# Patient Record
Sex: Male | Born: 1945 | Race: White | Hispanic: No | Marital: Married | Smoking: Former smoker
Health system: Southern US, Community
[De-identification: ages and names within clinical notes are randomized; demographics above are authoritative.]

## PROBLEM LIST (undated history)

## (undated) DIAGNOSIS — I714 Abdominal aortic aneurysm, without rupture, unspecified: Secondary | ICD-10-CM

## (undated) DIAGNOSIS — R319 Hematuria, unspecified: Secondary | ICD-10-CM

## (undated) DIAGNOSIS — K5792 Diverticulitis of intestine, part unspecified, without perforation or abscess without bleeding: Secondary | ICD-10-CM

## (undated) DIAGNOSIS — K922 Gastrointestinal hemorrhage, unspecified: Secondary | ICD-10-CM

## (undated) DIAGNOSIS — I739 Peripheral vascular disease, unspecified: Secondary | ICD-10-CM

## (undated) DIAGNOSIS — N400 Enlarged prostate without lower urinary tract symptoms: Secondary | ICD-10-CM

## (undated) HISTORY — PX: KNEE SURGERY: SHX244

## (undated) HISTORY — DX: Diverticulitis of intestine, part unspecified, without perforation or abscess without bleeding: K57.92

## (undated) HISTORY — DX: Abdominal aortic aneurysm, without rupture: I71.4

## (undated) HISTORY — PX: TONSILLECTOMY: SUR1361

## (undated) HISTORY — DX: Gastrointestinal hemorrhage, unspecified: K92.2

## (undated) HISTORY — PX: OTHER SURGICAL HISTORY: SHX169

## (undated) HISTORY — DX: Benign prostatic hyperplasia without lower urinary tract symptoms: N40.0

## (undated) HISTORY — DX: Abdominal aortic aneurysm, without rupture, unspecified: I71.40

## (undated) HISTORY — DX: Hematuria, unspecified: R31.9

## (undated) HISTORY — PX: ADENOIDECTOMY: SUR15

## (undated) HISTORY — DX: Peripheral vascular disease, unspecified: I73.9

---

## 1996-12-27 HISTORY — PX: OTHER SURGICAL HISTORY: SHX169

## 1999-11-17 ENCOUNTER — Encounter: Payer: Self-pay | Admitting: Family Medicine

## 1999-11-17 ENCOUNTER — Encounter: Admission: RE | Admit: 1999-11-17 | Discharge: 1999-11-17 | Payer: Self-pay | Admitting: Family Medicine

## 2000-06-24 ENCOUNTER — Ambulatory Visit (HOSPITAL_COMMUNITY): Admission: RE | Admit: 2000-06-24 | Discharge: 2000-06-24 | Payer: Self-pay | Admitting: Gastroenterology

## 2001-11-01 ENCOUNTER — Ambulatory Visit (HOSPITAL_COMMUNITY): Admission: RE | Admit: 2001-11-01 | Discharge: 2001-11-01 | Payer: Self-pay | Admitting: Urology

## 2001-11-01 ENCOUNTER — Encounter: Payer: Self-pay | Admitting: Urology

## 2006-07-21 ENCOUNTER — Inpatient Hospital Stay (HOSPITAL_COMMUNITY): Admission: EM | Admit: 2006-07-21 | Discharge: 2006-07-25 | Payer: Self-pay | Admitting: Emergency Medicine

## 2006-10-27 HISTORY — PX: OTHER SURGICAL HISTORY: SHX169

## 2006-11-16 ENCOUNTER — Inpatient Hospital Stay (HOSPITAL_COMMUNITY): Admission: RE | Admit: 2006-11-16 | Discharge: 2006-11-22 | Payer: Self-pay | Admitting: Surgery

## 2011-06-27 HISTORY — PX: COLONOSCOPY: SHX174

## 2012-05-11 ENCOUNTER — Other Ambulatory Visit: Payer: Self-pay | Admitting: Family Medicine

## 2012-05-11 DIAGNOSIS — I714 Abdominal aortic aneurysm, without rupture: Secondary | ICD-10-CM

## 2012-05-12 ENCOUNTER — Ambulatory Visit
Admission: RE | Admit: 2012-05-12 | Discharge: 2012-05-12 | Disposition: A | Discharge status: Home / Self Care | Payer: Medicare Other | Source: Ambulatory Visit | Attending: Family Medicine | Admitting: Family Medicine

## 2012-05-12 DIAGNOSIS — I714 Abdominal aortic aneurysm, without rupture: Secondary | ICD-10-CM

## 2012-05-12 MED ORDER — IOHEXOL 350 MG/ML SOLN
100.0000 mL | Freq: Once | INTRAVENOUS | Status: AC | PRN
  Administered 2012-05-12: 100 mL via INTRAVENOUS

## 2013-05-27 HISTORY — PX: OTHER SURGICAL HISTORY: SHX169

## 2013-06-07 ENCOUNTER — Other Ambulatory Visit: Payer: Self-pay | Admitting: Otolaryngology

## 2014-05-17 ENCOUNTER — Other Ambulatory Visit: Payer: Self-pay | Admitting: Family Medicine

## 2014-05-17 DIAGNOSIS — I714 Abdominal aortic aneurysm, without rupture, unspecified: Secondary | ICD-10-CM

## 2014-05-17 DIAGNOSIS — IMO0001 Reserved for inherently not codable concepts without codable children: Secondary | ICD-10-CM

## 2014-05-22 ENCOUNTER — Ambulatory Visit: Payer: Medicare PPO | Attending: Family Medicine

## 2014-05-22 DIAGNOSIS — IMO0001 Reserved for inherently not codable concepts without codable children: Secondary | ICD-10-CM | POA: Insufficient documentation

## 2014-05-22 DIAGNOSIS — M545 Low back pain, unspecified: Secondary | ICD-10-CM | POA: Insufficient documentation

## 2014-05-22 DIAGNOSIS — R5381 Other malaise: Secondary | ICD-10-CM | POA: Insufficient documentation

## 2014-05-23 ENCOUNTER — Ambulatory Visit
Admission: RE | Admit: 2014-05-23 | Discharge: 2014-05-23 | Disposition: A | Discharge status: Home / Self Care | Payer: Medicare PPO | Source: Ambulatory Visit | Attending: Family Medicine | Admitting: Family Medicine

## 2014-05-23 DIAGNOSIS — IMO0001 Reserved for inherently not codable concepts without codable children: Secondary | ICD-10-CM

## 2014-05-23 DIAGNOSIS — I714 Abdominal aortic aneurysm, without rupture, unspecified: Secondary | ICD-10-CM

## 2014-06-05 ENCOUNTER — Ambulatory Visit: Payer: Medicare PPO | Attending: Family Medicine

## 2014-06-05 DIAGNOSIS — IMO0001 Reserved for inherently not codable concepts without codable children: Secondary | ICD-10-CM | POA: Insufficient documentation

## 2014-06-05 DIAGNOSIS — R5381 Other malaise: Secondary | ICD-10-CM | POA: Insufficient documentation

## 2014-06-05 DIAGNOSIS — M545 Low back pain, unspecified: Secondary | ICD-10-CM | POA: Insufficient documentation

## 2014-06-19 ENCOUNTER — Ambulatory Visit: Payer: Medicare PPO

## 2014-06-26 ENCOUNTER — Ambulatory Visit: Payer: Medicare PPO | Attending: Family Medicine

## 2014-06-26 DIAGNOSIS — M545 Low back pain, unspecified: Secondary | ICD-10-CM | POA: Insufficient documentation

## 2014-06-26 DIAGNOSIS — R5381 Other malaise: Secondary | ICD-10-CM | POA: Insufficient documentation

## 2014-06-26 DIAGNOSIS — IMO0001 Reserved for inherently not codable concepts without codable children: Secondary | ICD-10-CM | POA: Diagnosis present

## 2014-07-11 DIAGNOSIS — M545 Low back pain, unspecified: Secondary | ICD-10-CM | POA: Insufficient documentation

## 2014-07-11 DIAGNOSIS — M25562 Pain in left knee: Secondary | ICD-10-CM | POA: Insufficient documentation

## 2014-07-11 DIAGNOSIS — M1990 Unspecified osteoarthritis, unspecified site: Secondary | ICD-10-CM | POA: Insufficient documentation

## 2014-07-11 DIAGNOSIS — M79642 Pain in left hand: Secondary | ICD-10-CM | POA: Insufficient documentation

## 2014-12-30 DIAGNOSIS — M25372 Other instability, left ankle: Secondary | ICD-10-CM | POA: Diagnosis not present

## 2014-12-30 DIAGNOSIS — M66872 Spontaneous rupture of other tendons, left ankle and foot: Secondary | ICD-10-CM | POA: Diagnosis not present

## 2014-12-30 DIAGNOSIS — M67972 Unspecified disorder of synovium and tendon, left ankle and foot: Secondary | ICD-10-CM | POA: Diagnosis not present

## 2014-12-30 DIAGNOSIS — M79672 Pain in left foot: Secondary | ICD-10-CM | POA: Diagnosis not present

## 2015-01-03 DIAGNOSIS — M66872 Spontaneous rupture of other tendons, left ankle and foot: Secondary | ICD-10-CM | POA: Diagnosis not present

## 2015-01-03 DIAGNOSIS — M67972 Unspecified disorder of synovium and tendon, left ankle and foot: Secondary | ICD-10-CM | POA: Diagnosis not present

## 2015-01-03 DIAGNOSIS — M79672 Pain in left foot: Secondary | ICD-10-CM | POA: Diagnosis not present

## 2015-01-03 DIAGNOSIS — M25372 Other instability, left ankle: Secondary | ICD-10-CM | POA: Diagnosis not present

## 2015-01-06 DIAGNOSIS — M66872 Spontaneous rupture of other tendons, left ankle and foot: Secondary | ICD-10-CM | POA: Diagnosis not present

## 2015-01-06 DIAGNOSIS — M25372 Other instability, left ankle: Secondary | ICD-10-CM | POA: Diagnosis not present

## 2015-01-06 DIAGNOSIS — M67972 Unspecified disorder of synovium and tendon, left ankle and foot: Secondary | ICD-10-CM | POA: Diagnosis not present

## 2015-01-06 DIAGNOSIS — M79672 Pain in left foot: Secondary | ICD-10-CM | POA: Diagnosis not present

## 2015-01-09 DIAGNOSIS — M25372 Other instability, left ankle: Secondary | ICD-10-CM | POA: Diagnosis not present

## 2015-01-09 DIAGNOSIS — M67972 Unspecified disorder of synovium and tendon, left ankle and foot: Secondary | ICD-10-CM | POA: Diagnosis not present

## 2015-01-09 DIAGNOSIS — M79672 Pain in left foot: Secondary | ICD-10-CM | POA: Diagnosis not present

## 2015-01-09 DIAGNOSIS — M66872 Spontaneous rupture of other tendons, left ankle and foot: Secondary | ICD-10-CM | POA: Diagnosis not present

## 2015-01-20 DIAGNOSIS — M25372 Other instability, left ankle: Secondary | ICD-10-CM | POA: Diagnosis not present

## 2015-01-20 DIAGNOSIS — M67972 Unspecified disorder of synovium and tendon, left ankle and foot: Secondary | ICD-10-CM | POA: Diagnosis not present

## 2015-01-20 DIAGNOSIS — M79672 Pain in left foot: Secondary | ICD-10-CM | POA: Diagnosis not present

## 2015-01-20 DIAGNOSIS — M66872 Spontaneous rupture of other tendons, left ankle and foot: Secondary | ICD-10-CM | POA: Diagnosis not present

## 2015-01-24 DIAGNOSIS — M67972 Unspecified disorder of synovium and tendon, left ankle and foot: Secondary | ICD-10-CM | POA: Diagnosis not present

## 2015-01-24 DIAGNOSIS — M66872 Spontaneous rupture of other tendons, left ankle and foot: Secondary | ICD-10-CM | POA: Diagnosis not present

## 2015-01-24 DIAGNOSIS — M79672 Pain in left foot: Secondary | ICD-10-CM | POA: Diagnosis not present

## 2015-01-24 DIAGNOSIS — M25372 Other instability, left ankle: Secondary | ICD-10-CM | POA: Diagnosis not present

## 2015-01-29 DIAGNOSIS — M67972 Unspecified disorder of synovium and tendon, left ankle and foot: Secondary | ICD-10-CM | POA: Diagnosis not present

## 2015-01-29 DIAGNOSIS — M66872 Spontaneous rupture of other tendons, left ankle and foot: Secondary | ICD-10-CM | POA: Diagnosis not present

## 2015-01-29 DIAGNOSIS — M25372 Other instability, left ankle: Secondary | ICD-10-CM | POA: Diagnosis not present

## 2015-01-29 DIAGNOSIS — M79672 Pain in left foot: Secondary | ICD-10-CM | POA: Diagnosis not present

## 2015-02-07 DIAGNOSIS — M67972 Unspecified disorder of synovium and tendon, left ankle and foot: Secondary | ICD-10-CM | POA: Diagnosis not present

## 2015-02-07 DIAGNOSIS — M66872 Spontaneous rupture of other tendons, left ankle and foot: Secondary | ICD-10-CM | POA: Diagnosis not present

## 2015-02-07 DIAGNOSIS — M25372 Other instability, left ankle: Secondary | ICD-10-CM | POA: Diagnosis not present

## 2015-02-07 DIAGNOSIS — M79672 Pain in left foot: Secondary | ICD-10-CM | POA: Diagnosis not present

## 2015-02-20 DIAGNOSIS — M66872 Spontaneous rupture of other tendons, left ankle and foot: Secondary | ICD-10-CM | POA: Diagnosis not present

## 2015-02-20 DIAGNOSIS — M79672 Pain in left foot: Secondary | ICD-10-CM | POA: Diagnosis not present

## 2015-02-20 DIAGNOSIS — M67972 Unspecified disorder of synovium and tendon, left ankle and foot: Secondary | ICD-10-CM | POA: Diagnosis not present

## 2015-02-20 DIAGNOSIS — M25372 Other instability, left ankle: Secondary | ICD-10-CM | POA: Diagnosis not present

## 2015-02-26 DIAGNOSIS — M67972 Unspecified disorder of synovium and tendon, left ankle and foot: Secondary | ICD-10-CM | POA: Diagnosis not present

## 2015-02-26 DIAGNOSIS — M79672 Pain in left foot: Secondary | ICD-10-CM | POA: Diagnosis not present

## 2015-02-26 DIAGNOSIS — M25372 Other instability, left ankle: Secondary | ICD-10-CM | POA: Diagnosis not present

## 2015-02-26 DIAGNOSIS — M66872 Spontaneous rupture of other tendons, left ankle and foot: Secondary | ICD-10-CM | POA: Diagnosis not present

## 2015-03-07 DIAGNOSIS — M25372 Other instability, left ankle: Secondary | ICD-10-CM | POA: Diagnosis not present

## 2015-03-07 DIAGNOSIS — M79672 Pain in left foot: Secondary | ICD-10-CM | POA: Diagnosis not present

## 2015-03-07 DIAGNOSIS — M67972 Unspecified disorder of synovium and tendon, left ankle and foot: Secondary | ICD-10-CM | POA: Diagnosis not present

## 2015-03-07 DIAGNOSIS — M66872 Spontaneous rupture of other tendons, left ankle and foot: Secondary | ICD-10-CM | POA: Diagnosis not present

## 2015-05-20 DIAGNOSIS — E780 Pure hypercholesterolemia: Secondary | ICD-10-CM | POA: Diagnosis not present

## 2015-05-20 DIAGNOSIS — Z79899 Other long term (current) drug therapy: Secondary | ICD-10-CM | POA: Diagnosis not present

## 2015-05-20 DIAGNOSIS — Z125 Encounter for screening for malignant neoplasm of prostate: Secondary | ICD-10-CM | POA: Diagnosis not present

## 2015-05-29 DIAGNOSIS — Z79899 Other long term (current) drug therapy: Secondary | ICD-10-CM | POA: Diagnosis not present

## 2015-05-29 DIAGNOSIS — E780 Pure hypercholesterolemia: Secondary | ICD-10-CM | POA: Diagnosis not present

## 2015-05-29 DIAGNOSIS — M1990 Unspecified osteoarthritis, unspecified site: Secondary | ICD-10-CM | POA: Diagnosis not present

## 2015-05-29 DIAGNOSIS — Z0000 Encounter for general adult medical examination without abnormal findings: Secondary | ICD-10-CM | POA: Diagnosis not present

## 2015-05-29 DIAGNOSIS — M545 Low back pain: Secondary | ICD-10-CM | POA: Diagnosis not present

## 2015-05-29 DIAGNOSIS — N400 Enlarged prostate without lower urinary tract symptoms: Secondary | ICD-10-CM | POA: Diagnosis not present

## 2015-05-29 DIAGNOSIS — R972 Elevated prostate specific antigen [PSA]: Secondary | ICD-10-CM | POA: Diagnosis not present

## 2015-05-29 DIAGNOSIS — N5201 Erectile dysfunction due to arterial insufficiency: Secondary | ICD-10-CM | POA: Diagnosis not present

## 2015-09-15 DIAGNOSIS — M545 Low back pain: Secondary | ICD-10-CM | POA: Diagnosis not present

## 2015-10-14 DIAGNOSIS — Z23 Encounter for immunization: Secondary | ICD-10-CM | POA: Diagnosis not present

## 2015-10-14 DIAGNOSIS — M5416 Radiculopathy, lumbar region: Secondary | ICD-10-CM | POA: Diagnosis not present

## 2015-10-16 ENCOUNTER — Other Ambulatory Visit: Payer: Self-pay | Admitting: Family Medicine

## 2015-10-16 DIAGNOSIS — M5416 Radiculopathy, lumbar region: Secondary | ICD-10-CM

## 2015-10-27 ENCOUNTER — Ambulatory Visit
Admission: RE | Admit: 2015-10-27 | Discharge: 2015-10-27 | Disposition: A | Discharge status: Home / Self Care | Payer: Commercial Managed Care - HMO | Source: Ambulatory Visit | Attending: Family Medicine | Admitting: Family Medicine

## 2015-10-27 DIAGNOSIS — M4806 Spinal stenosis, lumbar region: Secondary | ICD-10-CM | POA: Diagnosis not present

## 2015-10-27 DIAGNOSIS — M5416 Radiculopathy, lumbar region: Secondary | ICD-10-CM

## 2015-10-28 DIAGNOSIS — I714 Abdominal aortic aneurysm, without rupture: Secondary | ICD-10-CM | POA: Diagnosis not present

## 2015-10-28 DIAGNOSIS — M5416 Radiculopathy, lumbar region: Secondary | ICD-10-CM | POA: Diagnosis not present

## 2015-10-30 DIAGNOSIS — M5416 Radiculopathy, lumbar region: Secondary | ICD-10-CM | POA: Diagnosis not present

## 2015-10-30 DIAGNOSIS — M545 Low back pain: Secondary | ICD-10-CM | POA: Diagnosis not present

## 2015-11-04 DIAGNOSIS — M5416 Radiculopathy, lumbar region: Secondary | ICD-10-CM | POA: Diagnosis not present

## 2015-11-04 DIAGNOSIS — M545 Low back pain: Secondary | ICD-10-CM | POA: Diagnosis not present

## 2015-11-10 ENCOUNTER — Other Ambulatory Visit: Payer: Self-pay | Admitting: *Deleted

## 2015-11-10 DIAGNOSIS — M5416 Radiculopathy, lumbar region: Secondary | ICD-10-CM | POA: Diagnosis not present

## 2015-11-10 DIAGNOSIS — I714 Abdominal aortic aneurysm, without rupture, unspecified: Secondary | ICD-10-CM

## 2015-11-10 DIAGNOSIS — M545 Low back pain: Secondary | ICD-10-CM | POA: Diagnosis not present

## 2015-11-13 DIAGNOSIS — M5416 Radiculopathy, lumbar region: Secondary | ICD-10-CM | POA: Diagnosis not present

## 2015-11-13 DIAGNOSIS — M545 Low back pain: Secondary | ICD-10-CM | POA: Diagnosis not present

## 2015-11-17 ENCOUNTER — Encounter: Payer: Self-pay | Admitting: Surgery

## 2015-11-17 DIAGNOSIS — M545 Low back pain: Secondary | ICD-10-CM | POA: Diagnosis not present

## 2015-11-17 DIAGNOSIS — M5416 Radiculopathy, lumbar region: Secondary | ICD-10-CM | POA: Diagnosis not present

## 2015-11-19 ENCOUNTER — Ambulatory Visit (INDEPENDENT_AMBULATORY_CARE_PROVIDER_SITE_OTHER): Payer: Commercial Managed Care - HMO | Admitting: Surgery

## 2015-11-19 ENCOUNTER — Encounter: Payer: Self-pay | Admitting: Surgery

## 2015-11-19 ENCOUNTER — Ambulatory Visit (HOSPITAL_COMMUNITY)
Admission: RE | Admit: 2015-11-19 | Discharge: 2015-11-19 | Disposition: A | Discharge status: Home / Self Care | Payer: Commercial Managed Care - HMO | Source: Ambulatory Visit | Attending: Surgery | Admitting: Surgery

## 2015-11-19 VITALS — BP 163/93 | HR 75 | Temp 98.1°F | Resp 16 | Ht 69.0 in | Wt 232.0 lb

## 2015-11-19 DIAGNOSIS — I714 Abdominal aortic aneurysm, without rupture, unspecified: Secondary | ICD-10-CM

## 2015-11-19 DIAGNOSIS — M545 Low back pain: Secondary | ICD-10-CM | POA: Diagnosis not present

## 2015-11-19 DIAGNOSIS — M5416 Radiculopathy, lumbar region: Secondary | ICD-10-CM | POA: Diagnosis not present

## 2015-11-19 DIAGNOSIS — I739 Peripheral vascular disease, unspecified: Secondary | ICD-10-CM | POA: Diagnosis not present

## 2015-11-19 NOTE — Progress Notes (Signed)
Patient name: Tanner Hall MRN: [id number] DOB: Oct 01, 1946 Sex: male   Referred by: Dr. Harrington Challenger  Reason for referral:  Chief Complaint  Patient presents with  . New Evaluation    Resf. by Dr. Harrington Challenger  C/O  Lumbar Spine MRI  and found AAA   2 cm    HISTORY OF PRESENT ILLNESS: The patient is referred today for evaluation of an abdominal aortic aneurysm.  The patient is concerned about this because his father had a abdominal aortic aneurysm.  She died when she was 88 and never required getting this fixed.  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  This is increased from an ultrasound that was done a year ago revealing a 2.4 cm aortic aneurysm.  [place].  The patient is a former smoker, having quit 10 years ago.  He has had surgical treatment of diverticulitis.  He suffers chronic low back pain.  Past Medical History  Diagnosis Date  . BPH (benign prostatic hyperplasia)   . Hematuria   . PVD (peripheral vascular disease) ([place])   . Diverticulitis   . GI bleed   . AAA (abdominal aortic aneurysm) ([facility])     3.1 cm    Past Surgical History  Procedure Laterality Date  . Double inguinal repair  1998  . Partial colectomy for diverticulitis  10/2006  . Cystoscopy for hematuria      NO CANCER FOUND  . Colonoscopy  06/2011  . 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 Knee surgery      Social History   Social History  . Marital Status: Married    Spouse Name: N/A  . Number of Children: N/A  . Years of Education: N/A   Occupational History  . Not on file.   Social History Main Topics  . Smoking status: Former Smoker    Quit date: 11/18/2006  . Smokeless tobacco: Never Used  . Alcohol Use: Yes  . Drug Use: No  . Sexual Activity: Not on file   Other Topics Concern  . Not on file   Social History Narrative    Family History  Problem Relation Age of Onset  . CVA Mother   .  Leukemia Father   . Stomach cancer Paternal Grandfather     Allergies as of 11/19/2015  . (No Known Allergies)    Current Outpatient Prescriptions on File Prior to Visit  Medication Sig Dispense Refill  . aspirin-acetaminophen-caffeine (EXCEDRIN MIGRAINE) 250-250-65 MG per tablet Take 2 tablets by mouth every 6 (six) hours as needed for headache.    . Cinnamon 500 MG TABS Take 500 mg by mouth daily.    . fexofenadine-pseudoephedrine (ALLEGRA-D 24) 180-240 MG per 24 hr tablet Take 1 tablet by mouth daily.    Marland Kitchen KRILL OIL PO Take by mouth daily.    . meloxicam (MOBIC) 15 MG tablet Take 15 mg by mouth daily.    . Misc Natural Products (OSTEO BI-FLEX ADV JOINT SHIELD PO) Take by mouth daily.    . naproxen sodium (ANAPROX) 220 MG tablet Take 220 mg by mouth 2 (two) times daily with a meal.    . Prenatal Vit-Fe Fumarate-FA (M-VIT) tablet Take 1 tablet by mouth daily.    . Probiotic Product (PROBIOTIC DAILY) CAPS Take by mouth daily.    . sildenafil (VIAGRA) 100 MG tablet Take 100  mg by mouth daily as needed for erectile dysfunction.    . traMADol (ULTRAM) 50 MG tablet Take by mouth every 6 (six) hours as needed.    Marland Kitchen glucosamine-chondroitin 500-400 MG tablet Take 1 tablet by mouth 3 (three) times daily.    . Misc Natural Products (PROSTATE HEALTH) CAPS Take by mouth daily.     No current facility-administered medications on file prior to visit.     REVIEW OF SYSTEMS: Cardiovascular: No chest pain, chest pressure, palpitations, orthopnea, or dyspnea on exertion. No claudication or rest pain,  No history of DVT or phlebitis. Pulmonary: No productive cough, asthma or wheezing. Neurologic: No weakness, paresthesias, aphasia, or amaurosis. No dizziness. Hematologic: No bleeding problems or clotting disorders. Musculoskeletal: No joint pain or joint swelling. Gastrointestinal: No blood in stool or hematemesis Genitourinary: No dysuria or hematuria. Psychiatric:: No history of major  depression. Integumentary: No rashes or ulcers. Constitutional: No fever or chills.  PHYSICAL EXAMINATION:  Filed Vitals:   11/19/15 0926 11/19/15 0933  BP: 173/84 163/93  Pulse: 78 75  Temp: 98.1 F (36.7 C)   TempSrc: Oral   Resp: 16   Height: 5\' 9"  (1.753 m)   Weight: 232 lb (105.235 kg)   SpO2: 95%    Body mass index is 34.24 kg/(m^2). General: The patient appears their stated age.   HEENT:  No gross abnormalities Pulmonary: Respirations are non-labored Abdomen: Soft and non-tender  Musculoskeletal: There are no major deformities.   Neurologic: No focal weakness or paresthesias are detected, Skin: There are no ulcer or rashes noted. Psychiatric: The patient has normal affect. Cardiovascular: There is a regular rate and rhythm without significant murmur appreciated.  No carotid bruits.  Palpable pedal pulses  Diagnostic Studies: I have reviewed his ultrasound study which confirms a 3.1 cm abdominal aortic aneurysm.  Iliac vessels were not well evaluated secondary to overlying bowel gas.  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  Assessment:  Abdominal aortic aneurysm Plan: I have discussed the natural history of aneurysmal degeneration and the indications for repair.  I have recommended continued surveillance.  The threshold for aneurysm repair would be 5 cm or rapid growth.  I have elected to bring the patient back in one year with a repeat abdominal aortic aneurysm ultrasound.  4 Proventil purposes, consider placing the patient on a statin if his cholesterol is elevated and an ACE inhibitor if his blood pressure remains elevated.  I will defer these decisions to Dr. Alcus Dad. Leia Alf, M.D. [facility] Office: [phone] Pager:  [phone]

## 2015-11-19 NOTE — Addendum Note (Signed)
Addended by: Dorthula Rue L on: 11/19/2015 02:01 PM   Modules accepted: Orders

## 2015-11-19 NOTE — Progress Notes (Signed)
Filed Vitals:   11/19/15 0926 11/19/15 0933  BP: 173/84 163/93  Pulse: 78 75  Temp: 98.1 F (36.7 C)   TempSrc: Oral   Resp: 16   Height: 5\' 9"  (1.753 m)   Weight: 232 lb (105.235 kg)   SpO2: 95%

## 2015-11-27 DIAGNOSIS — Z0000 Encounter for general adult medical examination without abnormal findings: Secondary | ICD-10-CM | POA: Diagnosis not present

## 2015-11-27 DIAGNOSIS — M5416 Radiculopathy, lumbar region: Secondary | ICD-10-CM | POA: Diagnosis not present

## 2015-11-27 DIAGNOSIS — R972 Elevated prostate specific antigen [PSA]: Secondary | ICD-10-CM | POA: Diagnosis not present

## 2015-11-27 DIAGNOSIS — N400 Enlarged prostate without lower urinary tract symptoms: Secondary | ICD-10-CM | POA: Diagnosis not present

## 2015-11-27 DIAGNOSIS — N5201 Erectile dysfunction due to arterial insufficiency: Secondary | ICD-10-CM | POA: Diagnosis not present

## 2015-11-27 DIAGNOSIS — I714 Abdominal aortic aneurysm, without rupture: Secondary | ICD-10-CM | POA: Diagnosis not present

## 2015-11-27 DIAGNOSIS — Z79899 Other long term (current) drug therapy: Secondary | ICD-10-CM | POA: Diagnosis not present

## 2015-11-27 DIAGNOSIS — M545 Low back pain: Secondary | ICD-10-CM | POA: Diagnosis not present

## 2015-11-27 DIAGNOSIS — R7309 Other abnormal glucose: Secondary | ICD-10-CM | POA: Diagnosis not present

## 2015-11-27 DIAGNOSIS — E7800 Pure hypercholesterolemia, unspecified: Secondary | ICD-10-CM | POA: Diagnosis not present

## 2015-11-28 DIAGNOSIS — R05 Cough: Secondary | ICD-10-CM | POA: Diagnosis not present

## 2015-11-28 DIAGNOSIS — R7309 Other abnormal glucose: Secondary | ICD-10-CM | POA: Diagnosis not present

## 2015-11-28 DIAGNOSIS — N400 Enlarged prostate without lower urinary tract symptoms: Secondary | ICD-10-CM | POA: Diagnosis not present

## 2015-11-28 DIAGNOSIS — M545 Low back pain: Secondary | ICD-10-CM | POA: Diagnosis not present

## 2015-12-04 DIAGNOSIS — M5416 Radiculopathy, lumbar region: Secondary | ICD-10-CM | POA: Diagnosis not present

## 2015-12-04 DIAGNOSIS — M545 Low back pain: Secondary | ICD-10-CM | POA: Diagnosis not present

## 2015-12-10 DIAGNOSIS — M545 Low back pain: Secondary | ICD-10-CM | POA: Diagnosis not present

## 2015-12-10 DIAGNOSIS — M5416 Radiculopathy, lumbar region: Secondary | ICD-10-CM | POA: Diagnosis not present

## 2015-12-17 DIAGNOSIS — M545 Low back pain: Secondary | ICD-10-CM | POA: Diagnosis not present

## 2015-12-17 DIAGNOSIS — M5416 Radiculopathy, lumbar region: Secondary | ICD-10-CM | POA: Diagnosis not present

## 2016-06-02 DIAGNOSIS — Z131 Encounter for screening for diabetes mellitus: Secondary | ICD-10-CM | POA: Diagnosis not present

## 2016-06-02 DIAGNOSIS — I714 Abdominal aortic aneurysm, without rupture: Secondary | ICD-10-CM | POA: Diagnosis not present

## 2016-06-02 DIAGNOSIS — R972 Elevated prostate specific antigen [PSA]: Secondary | ICD-10-CM | POA: Diagnosis not present

## 2016-06-02 DIAGNOSIS — M549 Dorsalgia, unspecified: Secondary | ICD-10-CM | POA: Diagnosis not present

## 2016-06-02 DIAGNOSIS — Z0001 Encounter for general adult medical examination with abnormal findings: Secondary | ICD-10-CM | POA: Diagnosis not present

## 2016-06-03 ENCOUNTER — Other Ambulatory Visit: Payer: Self-pay | Admitting: Family Medicine

## 2016-06-03 DIAGNOSIS — I714 Abdominal aortic aneurysm, without rupture, unspecified: Secondary | ICD-10-CM

## 2016-06-11 ENCOUNTER — Ambulatory Visit
Admission: RE | Admit: 2016-06-11 | Discharge: 2016-06-11 | Disposition: A | Discharge status: Home / Self Care | Payer: Commercial Managed Care - HMO | Source: Ambulatory Visit | Attending: Family Medicine | Admitting: Family Medicine

## 2016-06-11 DIAGNOSIS — I714 Abdominal aortic aneurysm, without rupture, unspecified: Secondary | ICD-10-CM

## 2016-06-11 DIAGNOSIS — Z136 Encounter for screening for cardiovascular disorders: Secondary | ICD-10-CM | POA: Diagnosis not present

## 2016-08-06 DIAGNOSIS — M549 Dorsalgia, unspecified: Secondary | ICD-10-CM | POA: Diagnosis not present

## 2016-08-06 DIAGNOSIS — K649 Unspecified hemorrhoids: Secondary | ICD-10-CM | POA: Diagnosis not present

## 2016-08-17 DIAGNOSIS — M79606 Pain in leg, unspecified: Secondary | ICD-10-CM | POA: Diagnosis not present

## 2016-08-19 DIAGNOSIS — G5701 Lesion of sciatic nerve, right lower limb: Secondary | ICD-10-CM | POA: Diagnosis not present

## 2016-08-19 DIAGNOSIS — M79604 Pain in right leg: Secondary | ICD-10-CM | POA: Diagnosis not present

## 2016-08-23 DIAGNOSIS — M79604 Pain in right leg: Secondary | ICD-10-CM | POA: Diagnosis not present

## 2016-08-23 DIAGNOSIS — G5701 Lesion of sciatic nerve, right lower limb: Secondary | ICD-10-CM | POA: Diagnosis not present

## 2016-08-26 DIAGNOSIS — M79604 Pain in right leg: Secondary | ICD-10-CM | POA: Diagnosis not present

## 2016-08-26 DIAGNOSIS — G5701 Lesion of sciatic nerve, right lower limb: Secondary | ICD-10-CM | POA: Diagnosis not present

## 2016-09-01 DIAGNOSIS — G5701 Lesion of sciatic nerve, right lower limb: Secondary | ICD-10-CM | POA: Diagnosis not present

## 2016-09-01 DIAGNOSIS — M79604 Pain in right leg: Secondary | ICD-10-CM | POA: Diagnosis not present

## 2016-09-06 DIAGNOSIS — G5701 Lesion of sciatic nerve, right lower limb: Secondary | ICD-10-CM | POA: Diagnosis not present

## 2016-09-06 DIAGNOSIS — M79604 Pain in right leg: Secondary | ICD-10-CM | POA: Diagnosis not present

## 2016-09-09 DIAGNOSIS — M79604 Pain in right leg: Secondary | ICD-10-CM | POA: Diagnosis not present

## 2016-09-09 DIAGNOSIS — G5701 Lesion of sciatic nerve, right lower limb: Secondary | ICD-10-CM | POA: Diagnosis not present

## 2016-09-13 DIAGNOSIS — G5701 Lesion of sciatic nerve, right lower limb: Secondary | ICD-10-CM | POA: Diagnosis not present

## 2016-09-13 DIAGNOSIS — M79604 Pain in right leg: Secondary | ICD-10-CM | POA: Diagnosis not present

## 2016-09-16 DIAGNOSIS — G5701 Lesion of sciatic nerve, right lower limb: Secondary | ICD-10-CM | POA: Diagnosis not present

## 2016-09-16 DIAGNOSIS — M79604 Pain in right leg: Secondary | ICD-10-CM | POA: Diagnosis not present

## 2016-09-20 DIAGNOSIS — G5701 Lesion of sciatic nerve, right lower limb: Secondary | ICD-10-CM | POA: Diagnosis not present

## 2016-09-20 DIAGNOSIS — M79604 Pain in right leg: Secondary | ICD-10-CM | POA: Diagnosis not present

## 2016-09-23 DIAGNOSIS — G5701 Lesion of sciatic nerve, right lower limb: Secondary | ICD-10-CM | POA: Diagnosis not present

## 2016-09-23 DIAGNOSIS — M79604 Pain in right leg: Secondary | ICD-10-CM | POA: Diagnosis not present

## 2016-09-27 DIAGNOSIS — G5701 Lesion of sciatic nerve, right lower limb: Secondary | ICD-10-CM | POA: Diagnosis not present

## 2016-09-27 DIAGNOSIS — M79604 Pain in right leg: Secondary | ICD-10-CM | POA: Diagnosis not present

## 2016-09-30 DIAGNOSIS — G5701 Lesion of sciatic nerve, right lower limb: Secondary | ICD-10-CM | POA: Diagnosis not present

## 2016-09-30 DIAGNOSIS — M79604 Pain in right leg: Secondary | ICD-10-CM | POA: Diagnosis not present

## 2016-10-04 DIAGNOSIS — G5701 Lesion of sciatic nerve, right lower limb: Secondary | ICD-10-CM | POA: Diagnosis not present

## 2016-10-04 DIAGNOSIS — M79604 Pain in right leg: Secondary | ICD-10-CM | POA: Diagnosis not present

## 2016-10-11 DIAGNOSIS — M79604 Pain in right leg: Secondary | ICD-10-CM | POA: Diagnosis not present

## 2016-10-11 DIAGNOSIS — G5701 Lesion of sciatic nerve, right lower limb: Secondary | ICD-10-CM | POA: Diagnosis not present

## 2016-10-18 DIAGNOSIS — M79604 Pain in right leg: Secondary | ICD-10-CM | POA: Diagnosis not present

## 2016-10-18 DIAGNOSIS — G5701 Lesion of sciatic nerve, right lower limb: Secondary | ICD-10-CM | POA: Diagnosis not present

## 2016-10-20 DIAGNOSIS — Z23 Encounter for immunization: Secondary | ICD-10-CM | POA: Diagnosis not present

## 2016-10-20 DIAGNOSIS — T753XXA Motion sickness, initial encounter: Secondary | ICD-10-CM | POA: Diagnosis not present

## 2016-10-20 DIAGNOSIS — M779 Enthesopathy, unspecified: Secondary | ICD-10-CM | POA: Diagnosis not present

## 2016-10-25 DIAGNOSIS — G5701 Lesion of sciatic nerve, right lower limb: Secondary | ICD-10-CM | POA: Diagnosis not present

## 2016-10-25 DIAGNOSIS — M79604 Pain in right leg: Secondary | ICD-10-CM | POA: Diagnosis not present

## 2016-11-10 ENCOUNTER — Encounter: Payer: Self-pay | Admitting: Surgery

## 2016-11-22 ENCOUNTER — Ambulatory Visit: Payer: Commercial Managed Care - HMO | Admitting: Surgery

## 2016-11-22 ENCOUNTER — Inpatient Hospital Stay (HOSPITAL_COMMUNITY)
Admission: RE | Admit: 2016-11-22 | Discharge: 2016-11-22 | Disposition: A | Discharge status: Home / Self Care | Payer: Commercial Managed Care - HMO | Source: Ambulatory Visit | Attending: Surgery | Admitting: Surgery

## 2016-11-22 DIAGNOSIS — I714 Abdominal aortic aneurysm, without rupture, unspecified: Secondary | ICD-10-CM

## 2017-01-25 DIAGNOSIS — M549 Dorsalgia, unspecified: Secondary | ICD-10-CM | POA: Diagnosis not present

## 2017-01-25 DIAGNOSIS — G4710 Hypersomnia, unspecified: Secondary | ICD-10-CM | POA: Diagnosis not present

## 2017-01-25 DIAGNOSIS — R0683 Snoring: Secondary | ICD-10-CM | POA: Diagnosis not present

## 2017-03-03 DIAGNOSIS — R0683 Snoring: Secondary | ICD-10-CM | POA: Diagnosis not present

## 2017-03-03 DIAGNOSIS — R5383 Other fatigue: Secondary | ICD-10-CM | POA: Diagnosis not present

## 2017-03-30 DIAGNOSIS — G4733 Obstructive sleep apnea (adult) (pediatric): Secondary | ICD-10-CM | POA: Diagnosis not present

## 2017-03-31 DIAGNOSIS — G4733 Obstructive sleep apnea (adult) (pediatric): Secondary | ICD-10-CM | POA: Diagnosis not present

## 2017-04-11 DIAGNOSIS — G4733 Obstructive sleep apnea (adult) (pediatric): Secondary | ICD-10-CM | POA: Diagnosis not present

## 2017-05-11 DIAGNOSIS — G4733 Obstructive sleep apnea (adult) (pediatric): Secondary | ICD-10-CM | POA: Diagnosis not present

## 2017-06-01 DIAGNOSIS — R972 Elevated prostate specific antigen [PSA]: Secondary | ICD-10-CM | POA: Diagnosis not present

## 2017-06-01 DIAGNOSIS — Z79899 Other long term (current) drug therapy: Secondary | ICD-10-CM | POA: Diagnosis not present

## 2017-06-01 DIAGNOSIS — Z0000 Encounter for general adult medical examination without abnormal findings: Secondary | ICD-10-CM | POA: Diagnosis not present

## 2017-06-01 DIAGNOSIS — E7800 Pure hypercholesterolemia, unspecified: Secondary | ICD-10-CM | POA: Diagnosis not present

## 2017-06-01 DIAGNOSIS — Z23 Encounter for immunization: Secondary | ICD-10-CM | POA: Diagnosis not present

## 2017-06-01 DIAGNOSIS — M549 Dorsalgia, unspecified: Secondary | ICD-10-CM | POA: Diagnosis not present

## 2017-06-03 DIAGNOSIS — E7800 Pure hypercholesterolemia, unspecified: Secondary | ICD-10-CM | POA: Diagnosis not present

## 2017-06-03 DIAGNOSIS — Z0000 Encounter for general adult medical examination without abnormal findings: Secondary | ICD-10-CM | POA: Diagnosis not present

## 2017-06-03 DIAGNOSIS — Z79899 Other long term (current) drug therapy: Secondary | ICD-10-CM | POA: Diagnosis not present

## 2017-06-03 DIAGNOSIS — Z131 Encounter for screening for diabetes mellitus: Secondary | ICD-10-CM | POA: Diagnosis not present

## 2017-06-03 DIAGNOSIS — R972 Elevated prostate specific antigen [PSA]: Secondary | ICD-10-CM | POA: Diagnosis not present

## 2017-06-03 DIAGNOSIS — M549 Dorsalgia, unspecified: Secondary | ICD-10-CM | POA: Diagnosis not present

## 2017-06-11 DIAGNOSIS — G4733 Obstructive sleep apnea (adult) (pediatric): Secondary | ICD-10-CM | POA: Diagnosis not present

## 2017-06-15 DIAGNOSIS — G4733 Obstructive sleep apnea (adult) (pediatric): Secondary | ICD-10-CM | POA: Diagnosis not present

## 2017-07-11 DIAGNOSIS — G4733 Obstructive sleep apnea (adult) (pediatric): Secondary | ICD-10-CM | POA: Diagnosis not present

## 2017-08-11 DIAGNOSIS — G4733 Obstructive sleep apnea (adult) (pediatric): Secondary | ICD-10-CM | POA: Diagnosis not present

## 2017-09-11 DIAGNOSIS — G4733 Obstructive sleep apnea (adult) (pediatric): Secondary | ICD-10-CM | POA: Diagnosis not present

## 2017-09-14 IMAGING — MR MR LUMBAR SPINE W/O CM
4 of 5 series · 22 of 48 positions shown · non-contrast
Comparison: 09/15/2015 plain film examination.  No comparison MR.

CLINICAL DATA: 69-year-old male with low back pain extending down
right leg to ankle for the past 6 weeks. No known injury. Subsequent
encounter.

EXAM:
MRI LUMBAR SPINE WITHOUT CONTRAST
TECHNIQUE: Multiplanar, multisequence MR imaging of the lumbar spine was
performed. No intravenous contrast was administered.

[Series 6: T2 · sagittal · 4.0mm · 0.78mm/px · 7 of 15 slices shown (1 of 2)]
[im 1/15]
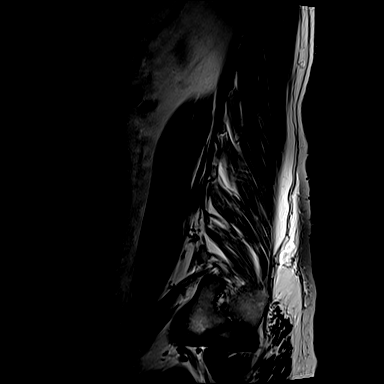
[im 3/15]
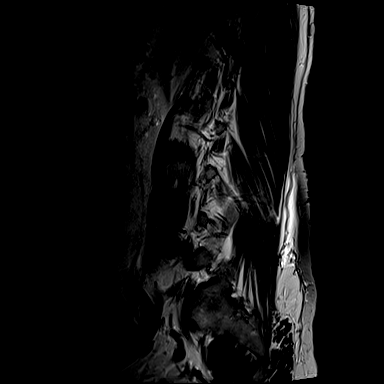
[im 5/15]
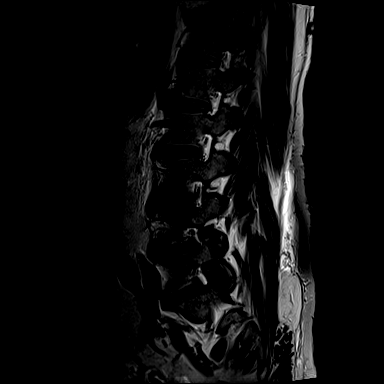
[im 8/15]
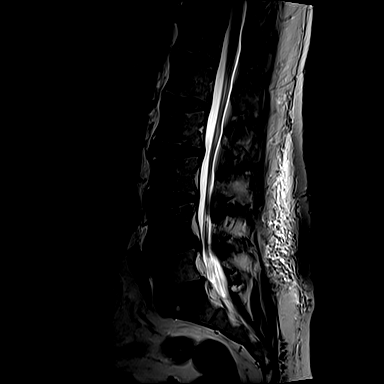
[im 10/15]
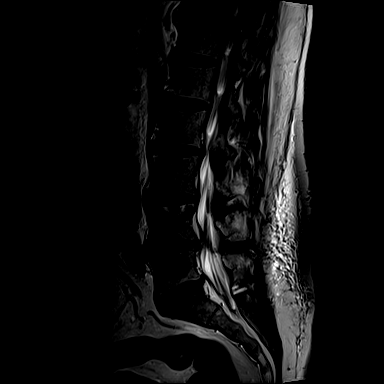
[im 12/15]
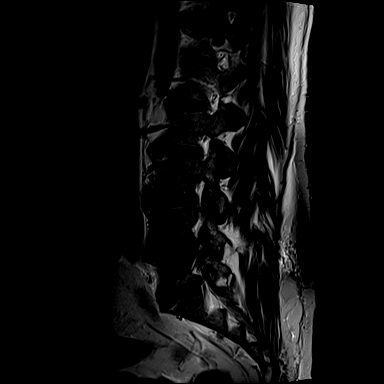
[im 15/15]
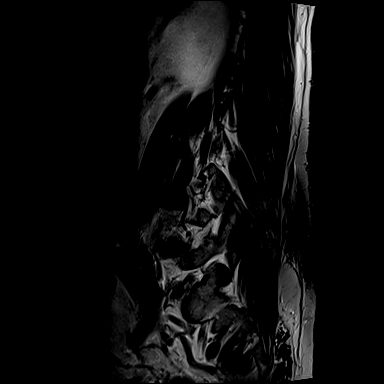

[Series 8: T1 · sagittal · 4.0mm · 0.88mm/px · 4 of 15 slices shown (1 of 2)]
[im 1/15]
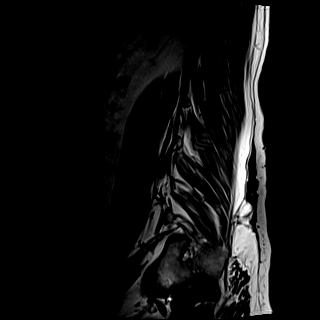
[im 3/15]
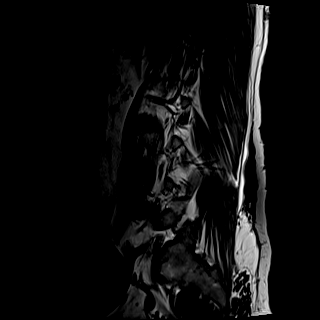
[im 9/15]
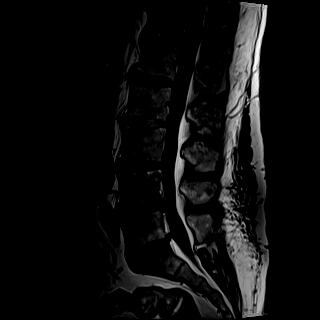
[im 15/15]
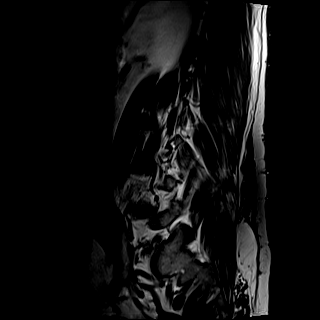

[Series 9: T2 · axial · 4.0mm · 0.28mm/px · z∈[-86,+104]mm · 8 of 34 slices shown (2 of 2)]
[im 1/34]
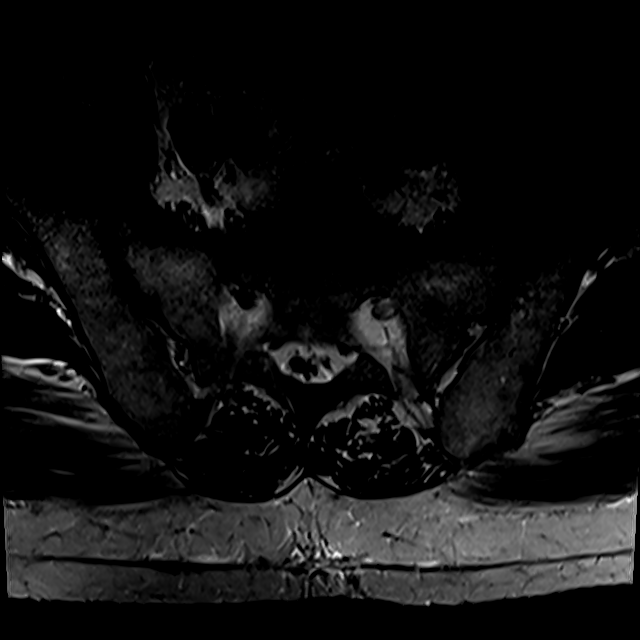
[im 6/34]
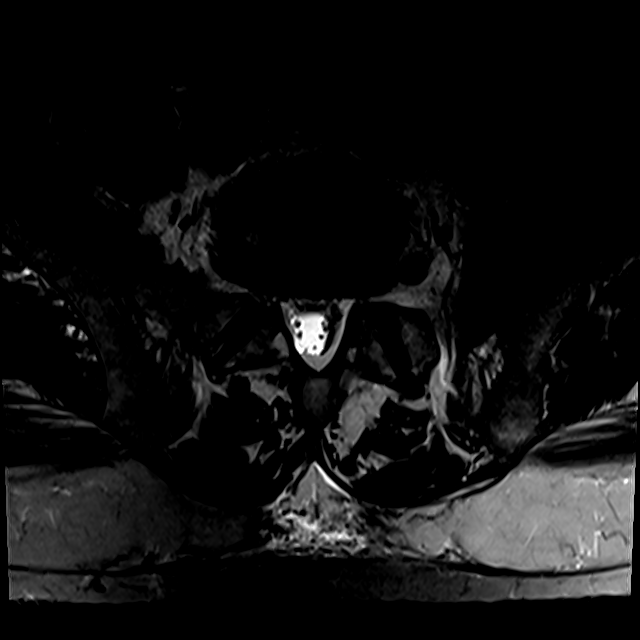
[im 11/34]
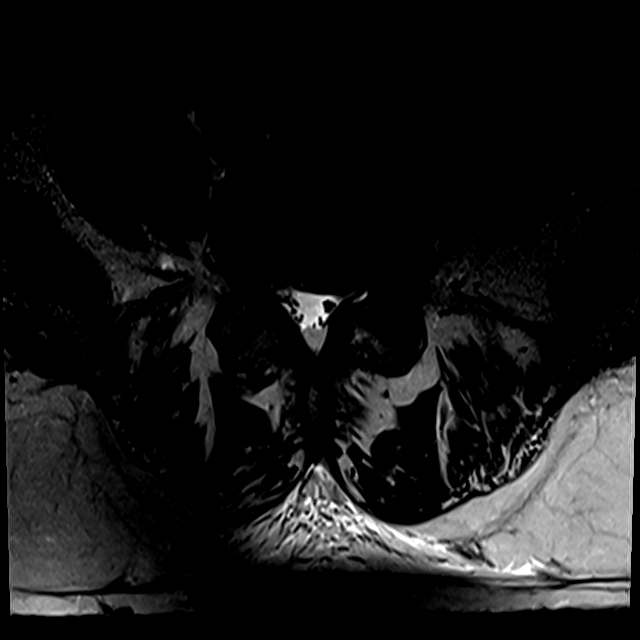
[im 16/34]
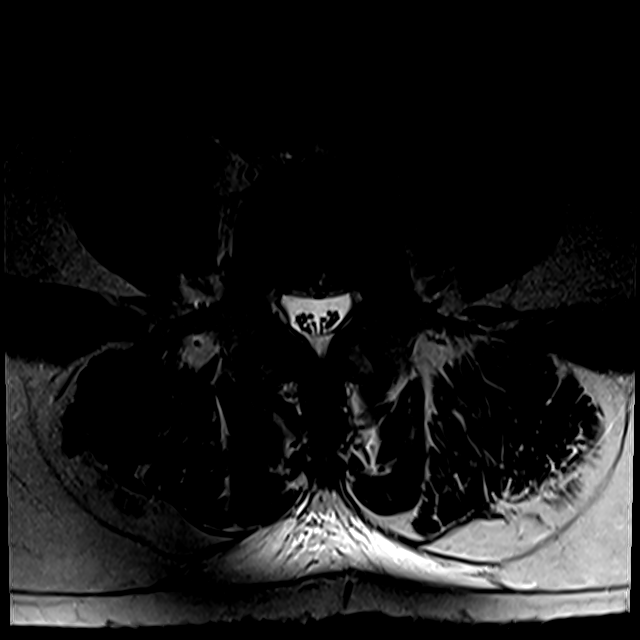
[im 18/34]
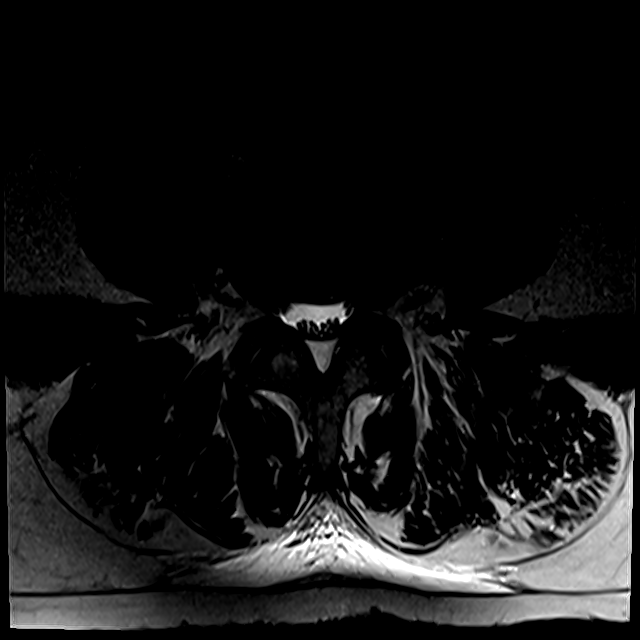
[im 23/34]
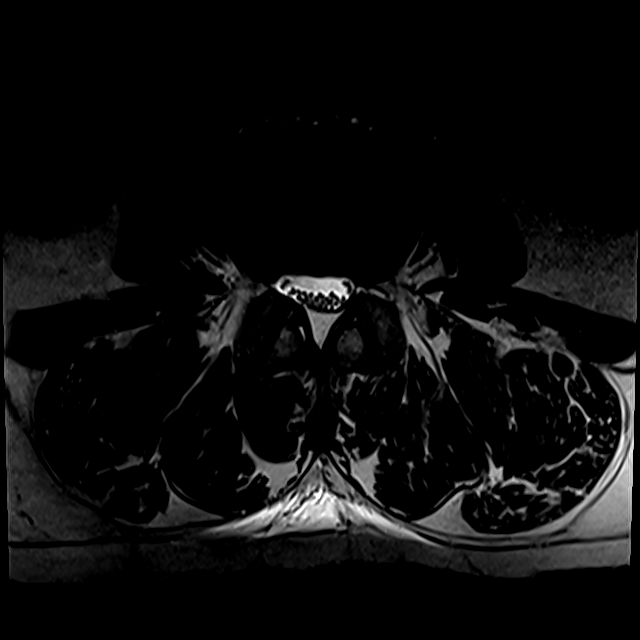
[im 28/34]
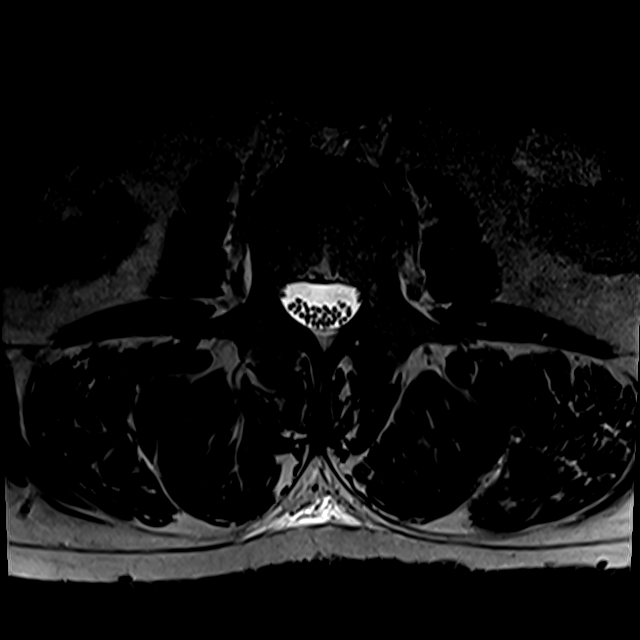
[im 34/34]
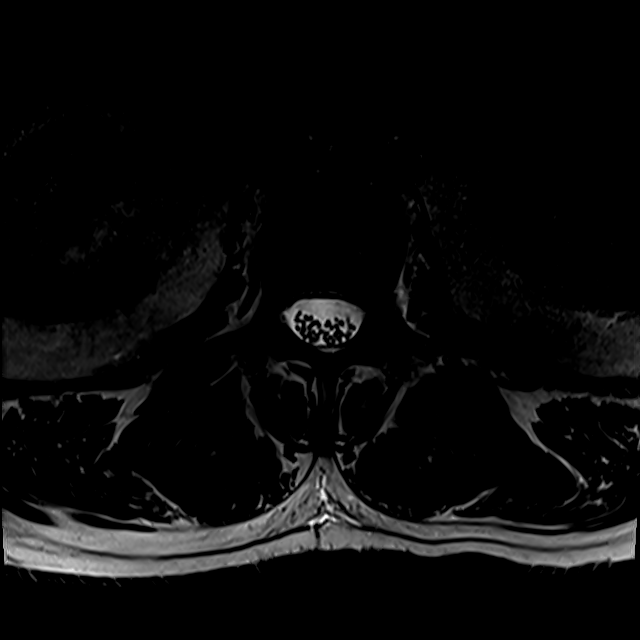

[Series 10: T1 · axial · 4.0mm · 0.56mm/px · z∈[-62,+75]mm · 3 of 34 slices shown (2 of 2)]
[im 6/34]
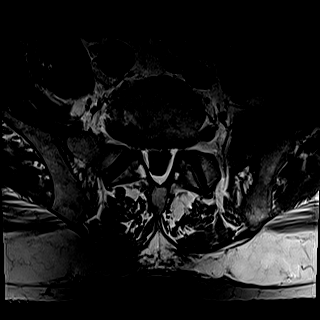
[im 18/34]
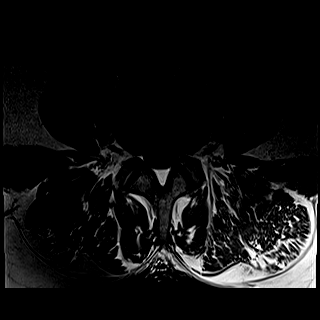
[im 28/34]
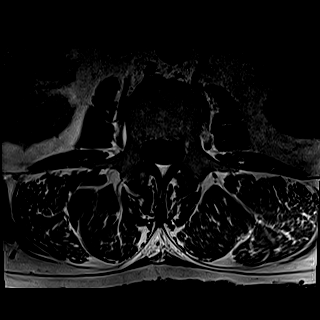

[22 of 48 positions shown; findings below may reference images not displayed]

FINDINGS: Last fully open disk space is labeled L5-S1. Present examination
incorporates from T11-12 disc space of lower sacrum. Conus L1 level.

Atherosclerotic type changes of the aorta and iliac arteries
incompletely assessed on present exam. Right common iliac artery
measures up to 2 cm. Abdominal aorta may measure up to 3.1 cm
although incompletely assessed.

Mild congenital narrowing of the spinal canal secondary to short
pedicles. Additionally:

T11-12: Minimal Schmorl's node deformity. Minimal bulge greater to
the right. Left facet joint degenerative changes with mild left
foraminal narrowing.

T12-L1:  Minimal Schmorl's node deformity.

L1-2:  Minimal facet joint degenerative changes.

L2-3: Mild bulge. Mild facet joint degenerative changes. Dorsal
epidural fat. Slight narrowing thecal sac.

L3-4: Mild bulge. Mild facet joint degenerative changes. Dorsal
epidural fat. Slight narrowing thecal sac.

L4-5: Bulge greatest centrally and to left. Facet joint degenerative
changes ligamentum flavum hypertrophy. Dorsal epidural fat. Mild
lateral recess stenosis and spinal stenosis greater on the left.
Mild left foraminal narrowing.

L5-S1: Facet joint degenerative changes. Bulge and spur with right
foraminal/ lateral extension crowding the exiting right L5 nerve
root. Of note is that there is an conjoint right L5-S1 nerve root.
IMPRESSION: Mild congenital narrowing of the spinal canal secondary to short
pedicles.

L5-S1 bulge and spur with right foraminal/ lateral extension
crowding the exiting right L5 nerve root. Of note is that there is
an conjoint right L5-S1 nerve root.

L4-5 bulge greatest centrally and to left. Multifactorial mild
lateral recess stenosis and spinal stenosis greater on the left.
Mild left foraminal narrowing.

L2-3 and L3-4 multifactorial slight narrowing of the thecal sac.

Atherosclerotic type changes of the aorta and iliac arteries. Right
common iliac artery measures up to 2 cm. Abdominal aorta may measure
up to 3.1 cm although incompletely assessed.

## 2017-09-27 ENCOUNTER — Other Ambulatory Visit: Payer: Self-pay | Admitting: Family Medicine

## 2017-09-27 DIAGNOSIS — L570 Actinic keratosis: Secondary | ICD-10-CM | POA: Diagnosis not present

## 2017-09-27 DIAGNOSIS — Z23 Encounter for immunization: Secondary | ICD-10-CM | POA: Diagnosis not present

## 2017-09-27 DIAGNOSIS — L821 Other seborrheic keratosis: Secondary | ICD-10-CM | POA: Diagnosis not present

## 2017-10-11 DIAGNOSIS — G4733 Obstructive sleep apnea (adult) (pediatric): Secondary | ICD-10-CM | POA: Diagnosis not present

## 2017-10-27 DIAGNOSIS — M25551 Pain in right hip: Secondary | ICD-10-CM | POA: Diagnosis not present

## 2017-10-27 DIAGNOSIS — M545 Low back pain: Secondary | ICD-10-CM | POA: Diagnosis not present

## 2017-11-04 DIAGNOSIS — M545 Low back pain: Secondary | ICD-10-CM | POA: Diagnosis not present

## 2017-11-08 DIAGNOSIS — M545 Low back pain: Secondary | ICD-10-CM | POA: Diagnosis not present

## 2017-11-09 DIAGNOSIS — M545 Low back pain: Secondary | ICD-10-CM | POA: Diagnosis not present

## 2017-11-11 DIAGNOSIS — G4733 Obstructive sleep apnea (adult) (pediatric): Secondary | ICD-10-CM | POA: Diagnosis not present

## 2017-11-21 DIAGNOSIS — M545 Low back pain: Secondary | ICD-10-CM | POA: Diagnosis not present

## 2017-11-24 DIAGNOSIS — M545 Low back pain: Secondary | ICD-10-CM | POA: Diagnosis not present

## 2017-11-28 DIAGNOSIS — M545 Low back pain: Secondary | ICD-10-CM | POA: Diagnosis not present

## 2017-12-01 DIAGNOSIS — M545 Low back pain: Secondary | ICD-10-CM | POA: Diagnosis not present

## 2017-12-06 DIAGNOSIS — M47816 Spondylosis without myelopathy or radiculopathy, lumbar region: Secondary | ICD-10-CM | POA: Diagnosis not present

## 2017-12-06 DIAGNOSIS — M545 Low back pain: Secondary | ICD-10-CM | POA: Diagnosis not present

## 2017-12-08 DIAGNOSIS — M545 Low back pain: Secondary | ICD-10-CM | POA: Diagnosis not present

## 2017-12-11 DIAGNOSIS — G4733 Obstructive sleep apnea (adult) (pediatric): Secondary | ICD-10-CM | POA: Diagnosis not present

## 2017-12-12 DIAGNOSIS — M545 Low back pain: Secondary | ICD-10-CM | POA: Diagnosis not present

## 2017-12-15 DIAGNOSIS — M545 Low back pain: Secondary | ICD-10-CM | POA: Diagnosis not present

## 2017-12-19 DIAGNOSIS — M545 Low back pain: Secondary | ICD-10-CM | POA: Diagnosis not present

## 2017-12-22 DIAGNOSIS — M545 Low back pain: Secondary | ICD-10-CM | POA: Diagnosis not present

## 2018-01-02 DIAGNOSIS — M545 Low back pain: Secondary | ICD-10-CM | POA: Diagnosis not present

## 2018-01-05 DIAGNOSIS — M545 Low back pain: Secondary | ICD-10-CM | POA: Diagnosis not present

## 2018-01-09 DIAGNOSIS — M545 Low back pain: Secondary | ICD-10-CM | POA: Diagnosis not present

## 2018-01-11 DIAGNOSIS — G4733 Obstructive sleep apnea (adult) (pediatric): Secondary | ICD-10-CM | POA: Diagnosis not present

## 2018-01-12 DIAGNOSIS — M545 Low back pain: Secondary | ICD-10-CM | POA: Diagnosis not present

## 2018-01-16 DIAGNOSIS — M545 Low back pain: Secondary | ICD-10-CM | POA: Diagnosis not present

## 2018-01-19 DIAGNOSIS — M545 Low back pain: Secondary | ICD-10-CM | POA: Diagnosis not present

## 2018-01-26 DIAGNOSIS — M545 Low back pain: Secondary | ICD-10-CM | POA: Diagnosis not present

## 2018-02-02 DIAGNOSIS — M545 Low back pain: Secondary | ICD-10-CM | POA: Diagnosis not present

## 2018-02-11 DIAGNOSIS — G4733 Obstructive sleep apnea (adult) (pediatric): Secondary | ICD-10-CM | POA: Diagnosis not present

## 2018-03-11 DIAGNOSIS — G4733 Obstructive sleep apnea (adult) (pediatric): Secondary | ICD-10-CM | POA: Diagnosis not present

## 2018-04-11 DIAGNOSIS — G4733 Obstructive sleep apnea (adult) (pediatric): Secondary | ICD-10-CM | POA: Diagnosis not present

## 2018-05-04 DIAGNOSIS — R69 Illness, unspecified: Secondary | ICD-10-CM | POA: Diagnosis not present

## 2018-05-11 DIAGNOSIS — R69 Illness, unspecified: Secondary | ICD-10-CM | POA: Diagnosis not present

## 2018-06-01 DIAGNOSIS — Z23 Encounter for immunization: Secondary | ICD-10-CM | POA: Diagnosis not present

## 2018-06-01 DIAGNOSIS — M549 Dorsalgia, unspecified: Secondary | ICD-10-CM | POA: Diagnosis not present

## 2018-06-01 DIAGNOSIS — E7800 Pure hypercholesterolemia, unspecified: Secondary | ICD-10-CM | POA: Diagnosis not present

## 2018-06-01 DIAGNOSIS — D485 Neoplasm of uncertain behavior of skin: Secondary | ICD-10-CM | POA: Diagnosis not present

## 2018-06-01 DIAGNOSIS — Z0000 Encounter for general adult medical examination without abnormal findings: Secondary | ICD-10-CM | POA: Diagnosis not present

## 2018-06-01 DIAGNOSIS — Z131 Encounter for screening for diabetes mellitus: Secondary | ICD-10-CM | POA: Diagnosis not present

## 2018-06-01 DIAGNOSIS — R69 Illness, unspecified: Secondary | ICD-10-CM | POA: Diagnosis not present

## 2018-06-01 DIAGNOSIS — Z79899 Other long term (current) drug therapy: Secondary | ICD-10-CM | POA: Diagnosis not present

## 2018-06-01 DIAGNOSIS — R972 Elevated prostate specific antigen [PSA]: Secondary | ICD-10-CM | POA: Diagnosis not present

## 2018-06-06 DIAGNOSIS — E1169 Type 2 diabetes mellitus with other specified complication: Secondary | ICD-10-CM | POA: Diagnosis not present

## 2018-06-06 DIAGNOSIS — D229 Melanocytic nevi, unspecified: Secondary | ICD-10-CM | POA: Diagnosis not present

## 2018-06-06 DIAGNOSIS — M549 Dorsalgia, unspecified: Secondary | ICD-10-CM | POA: Diagnosis not present

## 2018-06-06 DIAGNOSIS — B009 Herpesviral infection, unspecified: Secondary | ICD-10-CM | POA: Diagnosis not present

## 2018-06-06 DIAGNOSIS — Z79899 Other long term (current) drug therapy: Secondary | ICD-10-CM | POA: Diagnosis not present

## 2018-06-06 DIAGNOSIS — R972 Elevated prostate specific antigen [PSA]: Secondary | ICD-10-CM | POA: Diagnosis not present

## 2018-06-06 DIAGNOSIS — E7800 Pure hypercholesterolemia, unspecified: Secondary | ICD-10-CM | POA: Diagnosis not present

## 2018-06-06 DIAGNOSIS — Z1389 Encounter for screening for other disorder: Secondary | ICD-10-CM | POA: Diagnosis not present

## 2018-06-06 DIAGNOSIS — R21 Rash and other nonspecific skin eruption: Secondary | ICD-10-CM | POA: Diagnosis not present

## 2018-06-06 DIAGNOSIS — Z0000 Encounter for general adult medical examination without abnormal findings: Secondary | ICD-10-CM | POA: Diagnosis not present

## 2018-07-20 DIAGNOSIS — G4733 Obstructive sleep apnea (adult) (pediatric): Secondary | ICD-10-CM | POA: Diagnosis not present

## 2018-07-20 DIAGNOSIS — R05 Cough: Secondary | ICD-10-CM | POA: Diagnosis not present

## 2018-07-26 DIAGNOSIS — M545 Low back pain: Secondary | ICD-10-CM | POA: Diagnosis not present

## 2018-07-26 DIAGNOSIS — M47816 Spondylosis without myelopathy or radiculopathy, lumbar region: Secondary | ICD-10-CM | POA: Diagnosis not present

## 2018-07-31 DIAGNOSIS — M549 Dorsalgia, unspecified: Secondary | ICD-10-CM | POA: Diagnosis not present

## 2018-08-07 DIAGNOSIS — M549 Dorsalgia, unspecified: Secondary | ICD-10-CM | POA: Diagnosis not present

## 2018-08-16 DIAGNOSIS — G4733 Obstructive sleep apnea (adult) (pediatric): Secondary | ICD-10-CM | POA: Diagnosis not present

## 2018-08-16 DIAGNOSIS — M549 Dorsalgia, unspecified: Secondary | ICD-10-CM | POA: Diagnosis not present

## 2018-08-23 DIAGNOSIS — M549 Dorsalgia, unspecified: Secondary | ICD-10-CM | POA: Diagnosis not present

## 2018-08-30 DIAGNOSIS — M5416 Radiculopathy, lumbar region: Secondary | ICD-10-CM | POA: Diagnosis not present

## 2018-08-30 DIAGNOSIS — M545 Low back pain: Secondary | ICD-10-CM | POA: Diagnosis not present

## 2018-08-30 DIAGNOSIS — M47816 Spondylosis without myelopathy or radiculopathy, lumbar region: Secondary | ICD-10-CM | POA: Diagnosis not present

## 2018-09-06 DIAGNOSIS — M549 Dorsalgia, unspecified: Secondary | ICD-10-CM | POA: Diagnosis not present

## 2018-09-08 DIAGNOSIS — M5416 Radiculopathy, lumbar region: Secondary | ICD-10-CM | POA: Diagnosis not present

## 2018-09-08 DIAGNOSIS — M545 Low back pain: Secondary | ICD-10-CM | POA: Diagnosis not present

## 2018-09-08 DIAGNOSIS — M47816 Spondylosis without myelopathy or radiculopathy, lumbar region: Secondary | ICD-10-CM | POA: Diagnosis not present

## 2018-09-13 DIAGNOSIS — M549 Dorsalgia, unspecified: Secondary | ICD-10-CM | POA: Diagnosis not present

## 2018-09-20 DIAGNOSIS — M549 Dorsalgia, unspecified: Secondary | ICD-10-CM | POA: Diagnosis not present

## 2018-09-25 DIAGNOSIS — M549 Dorsalgia, unspecified: Secondary | ICD-10-CM | POA: Diagnosis not present

## 2018-10-04 DIAGNOSIS — R05 Cough: Secondary | ICD-10-CM | POA: Diagnosis not present

## 2018-10-04 DIAGNOSIS — E1169 Type 2 diabetes mellitus with other specified complication: Secondary | ICD-10-CM | POA: Diagnosis not present

## 2018-10-04 DIAGNOSIS — M549 Dorsalgia, unspecified: Secondary | ICD-10-CM | POA: Diagnosis not present

## 2018-10-04 DIAGNOSIS — Z23 Encounter for immunization: Secondary | ICD-10-CM | POA: Diagnosis not present

## 2018-10-11 DIAGNOSIS — M549 Dorsalgia, unspecified: Secondary | ICD-10-CM | POA: Diagnosis not present

## 2018-10-13 DIAGNOSIS — M79609 Pain in unspecified limb: Secondary | ICD-10-CM | POA: Diagnosis not present

## 2018-10-18 DIAGNOSIS — M549 Dorsalgia, unspecified: Secondary | ICD-10-CM | POA: Diagnosis not present

## 2018-10-23 DIAGNOSIS — D225 Melanocytic nevi of trunk: Secondary | ICD-10-CM | POA: Diagnosis not present

## 2018-10-23 DIAGNOSIS — L570 Actinic keratosis: Secondary | ICD-10-CM | POA: Diagnosis not present

## 2018-10-23 DIAGNOSIS — L720 Epidermal cyst: Secondary | ICD-10-CM | POA: Diagnosis not present

## 2018-10-23 DIAGNOSIS — L814 Other melanin hyperpigmentation: Secondary | ICD-10-CM | POA: Diagnosis not present

## 2018-10-26 DIAGNOSIS — M549 Dorsalgia, unspecified: Secondary | ICD-10-CM | POA: Diagnosis not present

## 2018-10-30 DIAGNOSIS — M47816 Spondylosis without myelopathy or radiculopathy, lumbar region: Secondary | ICD-10-CM | POA: Diagnosis not present

## 2018-10-30 DIAGNOSIS — M5416 Radiculopathy, lumbar region: Secondary | ICD-10-CM | POA: Diagnosis not present

## 2018-10-30 DIAGNOSIS — M545 Low back pain: Secondary | ICD-10-CM | POA: Diagnosis not present

## 2018-11-01 DIAGNOSIS — M549 Dorsalgia, unspecified: Secondary | ICD-10-CM | POA: Diagnosis not present

## 2018-11-08 DIAGNOSIS — M549 Dorsalgia, unspecified: Secondary | ICD-10-CM | POA: Diagnosis not present

## 2018-11-15 DIAGNOSIS — M549 Dorsalgia, unspecified: Secondary | ICD-10-CM | POA: Diagnosis not present

## 2018-11-16 DIAGNOSIS — M5416 Radiculopathy, lumbar region: Secondary | ICD-10-CM | POA: Diagnosis not present

## 2018-11-16 DIAGNOSIS — M545 Low back pain: Secondary | ICD-10-CM | POA: Diagnosis not present

## 2018-11-22 DIAGNOSIS — M549 Dorsalgia, unspecified: Secondary | ICD-10-CM | POA: Diagnosis not present

## 2018-11-29 DIAGNOSIS — M549 Dorsalgia, unspecified: Secondary | ICD-10-CM | POA: Diagnosis not present

## 2018-11-30 DIAGNOSIS — H5210 Myopia, unspecified eye: Secondary | ICD-10-CM | POA: Diagnosis not present

## 2018-12-06 DIAGNOSIS — M549 Dorsalgia, unspecified: Secondary | ICD-10-CM | POA: Diagnosis not present

## 2018-12-13 DIAGNOSIS — M549 Dorsalgia, unspecified: Secondary | ICD-10-CM | POA: Diagnosis not present

## 2018-12-14 DIAGNOSIS — M47816 Spondylosis without myelopathy or radiculopathy, lumbar region: Secondary | ICD-10-CM | POA: Diagnosis not present

## 2018-12-14 DIAGNOSIS — M545 Low back pain: Secondary | ICD-10-CM | POA: Diagnosis not present

## 2018-12-15 DIAGNOSIS — H52209 Unspecified astigmatism, unspecified eye: Secondary | ICD-10-CM | POA: Diagnosis not present

## 2018-12-15 DIAGNOSIS — H5203 Hypermetropia, bilateral: Secondary | ICD-10-CM | POA: Diagnosis not present

## 2018-12-26 DIAGNOSIS — H40013 Open angle with borderline findings, low risk, bilateral: Secondary | ICD-10-CM | POA: Diagnosis not present

## 2018-12-29 DIAGNOSIS — M549 Dorsalgia, unspecified: Secondary | ICD-10-CM | POA: Diagnosis not present

## 2018-12-29 DIAGNOSIS — E1169 Type 2 diabetes mellitus with other specified complication: Secondary | ICD-10-CM | POA: Diagnosis not present

## 2019-01-03 DIAGNOSIS — M47816 Spondylosis without myelopathy or radiculopathy, lumbar region: Secondary | ICD-10-CM | POA: Diagnosis not present

## 2019-02-28 DIAGNOSIS — R509 Fever, unspecified: Secondary | ICD-10-CM | POA: Diagnosis not present

## 2019-06-12 DIAGNOSIS — L720 Epidermal cyst: Secondary | ICD-10-CM | POA: Diagnosis not present

## 2019-06-27 DIAGNOSIS — H40013 Open angle with borderline findings, low risk, bilateral: Secondary | ICD-10-CM | POA: Diagnosis not present

## 2019-06-28 DIAGNOSIS — E7800 Pure hypercholesterolemia, unspecified: Secondary | ICD-10-CM | POA: Diagnosis not present

## 2019-06-28 DIAGNOSIS — Z125 Encounter for screening for malignant neoplasm of prostate: Secondary | ICD-10-CM | POA: Diagnosis not present

## 2019-06-28 DIAGNOSIS — E1169 Type 2 diabetes mellitus with other specified complication: Secondary | ICD-10-CM | POA: Diagnosis not present

## 2019-06-28 DIAGNOSIS — Z79899 Other long term (current) drug therapy: Secondary | ICD-10-CM | POA: Diagnosis not present

## 2019-07-04 DIAGNOSIS — M549 Dorsalgia, unspecified: Secondary | ICD-10-CM | POA: Diagnosis not present

## 2019-07-04 DIAGNOSIS — Z79899 Other long term (current) drug therapy: Secondary | ICD-10-CM | POA: Diagnosis not present

## 2019-07-04 DIAGNOSIS — M5430 Sciatica, unspecified side: Secondary | ICD-10-CM | POA: Diagnosis not present

## 2019-07-04 DIAGNOSIS — E7800 Pure hypercholesterolemia, unspecified: Secondary | ICD-10-CM | POA: Diagnosis not present

## 2019-07-04 DIAGNOSIS — Z0000 Encounter for general adult medical examination without abnormal findings: Secondary | ICD-10-CM | POA: Diagnosis not present

## 2019-07-04 DIAGNOSIS — R972 Elevated prostate specific antigen [PSA]: Secondary | ICD-10-CM | POA: Diagnosis not present

## 2019-07-04 DIAGNOSIS — E1169 Type 2 diabetes mellitus with other specified complication: Secondary | ICD-10-CM | POA: Diagnosis not present

## 2019-07-04 DIAGNOSIS — B009 Herpesviral infection, unspecified: Secondary | ICD-10-CM | POA: Diagnosis not present

## 2019-07-19 DIAGNOSIS — M5416 Radiculopathy, lumbar region: Secondary | ICD-10-CM | POA: Diagnosis not present

## 2019-07-19 DIAGNOSIS — M47816 Spondylosis without myelopathy or radiculopathy, lumbar region: Secondary | ICD-10-CM | POA: Diagnosis not present

## 2019-07-19 DIAGNOSIS — M545 Low back pain: Secondary | ICD-10-CM | POA: Diagnosis not present

## 2019-07-19 DIAGNOSIS — Z6836 Body mass index (BMI) 36.0-36.9, adult: Secondary | ICD-10-CM | POA: Diagnosis not present

## 2019-08-30 DIAGNOSIS — G4733 Obstructive sleep apnea (adult) (pediatric): Secondary | ICD-10-CM | POA: Diagnosis not present

## 2019-08-31 DIAGNOSIS — M47816 Spondylosis without myelopathy or radiculopathy, lumbar region: Secondary | ICD-10-CM | POA: Diagnosis not present

## 2019-08-31 DIAGNOSIS — M545 Low back pain: Secondary | ICD-10-CM | POA: Diagnosis not present

## 2019-08-31 DIAGNOSIS — M5416 Radiculopathy, lumbar region: Secondary | ICD-10-CM | POA: Diagnosis not present

## 2019-09-18 DIAGNOSIS — G4733 Obstructive sleep apnea (adult) (pediatric): Secondary | ICD-10-CM | POA: Diagnosis not present

## 2019-10-25 DIAGNOSIS — M47816 Spondylosis without myelopathy or radiculopathy, lumbar region: Secondary | ICD-10-CM | POA: Diagnosis not present

## 2019-10-25 DIAGNOSIS — M545 Low back pain: Secondary | ICD-10-CM | POA: Diagnosis not present

## 2019-10-25 DIAGNOSIS — M5416 Radiculopathy, lumbar region: Secondary | ICD-10-CM | POA: Diagnosis not present

## 2019-11-07 DIAGNOSIS — L814 Other melanin hyperpigmentation: Secondary | ICD-10-CM | POA: Diagnosis not present

## 2019-11-07 DIAGNOSIS — Z23 Encounter for immunization: Secondary | ICD-10-CM | POA: Diagnosis not present

## 2019-11-07 DIAGNOSIS — L918 Other hypertrophic disorders of the skin: Secondary | ICD-10-CM | POA: Diagnosis not present

## 2019-11-07 DIAGNOSIS — L820 Inflamed seborrheic keratosis: Secondary | ICD-10-CM | POA: Diagnosis not present

## 2019-11-07 DIAGNOSIS — L821 Other seborrheic keratosis: Secondary | ICD-10-CM | POA: Diagnosis not present

## 2019-11-07 DIAGNOSIS — D225 Melanocytic nevi of trunk: Secondary | ICD-10-CM | POA: Diagnosis not present

## 2019-11-07 DIAGNOSIS — L570 Actinic keratosis: Secondary | ICD-10-CM | POA: Diagnosis not present

## 2019-11-12 DIAGNOSIS — M47896 Other spondylosis, lumbar region: Secondary | ICD-10-CM | POA: Diagnosis not present

## 2019-11-12 DIAGNOSIS — M5416 Radiculopathy, lumbar region: Secondary | ICD-10-CM | POA: Diagnosis not present

## 2019-11-15 DIAGNOSIS — M47896 Other spondylosis, lumbar region: Secondary | ICD-10-CM | POA: Diagnosis not present

## 2019-11-15 DIAGNOSIS — M5416 Radiculopathy, lumbar region: Secondary | ICD-10-CM | POA: Diagnosis not present

## 2019-11-19 DIAGNOSIS — M5416 Radiculopathy, lumbar region: Secondary | ICD-10-CM | POA: Diagnosis not present

## 2019-11-19 DIAGNOSIS — M47896 Other spondylosis, lumbar region: Secondary | ICD-10-CM | POA: Diagnosis not present

## 2019-11-20 DIAGNOSIS — M5416 Radiculopathy, lumbar region: Secondary | ICD-10-CM | POA: Diagnosis not present

## 2019-11-20 DIAGNOSIS — M47896 Other spondylosis, lumbar region: Secondary | ICD-10-CM | POA: Diagnosis not present

## 2019-11-26 DIAGNOSIS — M47896 Other spondylosis, lumbar region: Secondary | ICD-10-CM | POA: Diagnosis not present

## 2019-11-26 DIAGNOSIS — M5416 Radiculopathy, lumbar region: Secondary | ICD-10-CM | POA: Diagnosis not present

## 2019-11-27 DIAGNOSIS — M5416 Radiculopathy, lumbar region: Secondary | ICD-10-CM | POA: Diagnosis not present

## 2019-11-27 DIAGNOSIS — M47896 Other spondylosis, lumbar region: Secondary | ICD-10-CM | POA: Diagnosis not present

## 2019-11-28 DIAGNOSIS — H5210 Myopia, unspecified eye: Secondary | ICD-10-CM | POA: Diagnosis not present

## 2019-11-29 DIAGNOSIS — M47896 Other spondylosis, lumbar region: Secondary | ICD-10-CM | POA: Diagnosis not present

## 2019-11-29 DIAGNOSIS — M5416 Radiculopathy, lumbar region: Secondary | ICD-10-CM | POA: Diagnosis not present

## 2019-12-03 DIAGNOSIS — H9192 Unspecified hearing loss, left ear: Secondary | ICD-10-CM | POA: Diagnosis not present

## 2019-12-03 DIAGNOSIS — M47896 Other spondylosis, lumbar region: Secondary | ICD-10-CM | POA: Diagnosis not present

## 2019-12-03 DIAGNOSIS — M5416 Radiculopathy, lumbar region: Secondary | ICD-10-CM | POA: Diagnosis not present

## 2019-12-04 ENCOUNTER — Ambulatory Visit (INDEPENDENT_AMBULATORY_CARE_PROVIDER_SITE_OTHER): Payer: Medicare HMO | Admitting: Otolaryngology

## 2019-12-04 ENCOUNTER — Other Ambulatory Visit: Payer: Self-pay

## 2019-12-04 ENCOUNTER — Encounter (INDEPENDENT_AMBULATORY_CARE_PROVIDER_SITE_OTHER): Payer: Self-pay | Admitting: Otolaryngology

## 2019-12-04 VITALS — Temp 98.1°F

## 2019-12-04 DIAGNOSIS — J310 Chronic rhinitis: Secondary | ICD-10-CM | POA: Diagnosis not present

## 2019-12-04 DIAGNOSIS — H9120 Sudden idiopathic hearing loss, unspecified ear: Secondary | ICD-10-CM | POA: Diagnosis not present

## 2019-12-04 DIAGNOSIS — H6122 Impacted cerumen, left ear: Secondary | ICD-10-CM | POA: Diagnosis not present

## 2019-12-04 NOTE — Progress Notes (Signed)
HPI: Tanner Hall is a 73 y.o. male who presents is referred by PCP for evaluation of left ear hearing loss.  This apparently initially began a little over a week ago with what he noticed sudden left ear hearing loss where he had difficulty hearing.  He hears a little bit better presently but still notices much better hearing in the right ear compared to the left.  He was prescribed steroids but has not started them yet.  He denies any vertigo.  No ear pain.  Denies any loud noise exposure or trauma to the left ear. He does complain of moderate nasal congestion and coughing up mostly clear mucus.  He has had no fever. 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  Past Medical History:  Diagnosis Date  . AAA (abdominal aortic aneurysm) ([facility])    3.1 cm  . BPH (benign prostatic hyperplasia)   . Diverticulitis   . GI bleed   . Hematuria   . PVD (peripheral vascular disease) ([place])    Past Surgical History:  Procedure Laterality Date  . COLONOSCOPY  06/2011  . CYSTOSCOPY FOR HEMATURIA     NO CANCER FOUND  . DOUBLE INGUINAL REPAIR  1998  . KNEE SURGERY    . 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 PARTIAL COLECTOMY FOR DIVERTICULITIS  10/2006   Social History   Socioeconomic History  . Marital status: Married    Spouse name: Not on file  . Number of children: Not on file  . Years of education: Not on file  . Highest education level: Not on file  Occupational History  . Not on file  Social Needs  . Financial resource strain: Not on file  . Food insecurity    Worry: Not on file    Inability: Not on file  . Transportation needs    Medical: Not on file    Non-medical: Not on file  Tobacco Use  . Smoking status: Former Smoker    Packs/day: 1.50    Years: 44.00    Pack years: 66.00    Start date: 1963    Quit date: 11/18/2006    Years since quitting: 13.0  . Smokeless tobacco: Never Used   Substance and Sexual Activity  . Alcohol use: Yes  . Drug use: No  . Sexual activity: Not on file  Lifestyle  . Physical activity    Days per week: Not on file    Minutes per session: Not on file  . Stress: Not on file  Relationships  . Social Herbalist on phone: Not on file    Gets together: Not on file    Attends religious service: Not on file    Active member of club or organization: Not on file    Attends meetings of clubs or organizations: Not on file    Relationship status: Not on file  Other Topics Concern  . Not on file  Social History Narrative  . Not on file   Family History  Problem Relation Age of Onset  . CVA Mother   . Leukemia Father   . Stomach cancer Paternal Grandfather    No Known Allergies Prior to Admission medications   Medication Sig Start Date End Date Taking? Authorizing Provider  aspirin-acetaminophen-caffeine (EXCEDRIN MIGRAINE) [phone] MG per tablet Take 2 tablets by mouth every 6 (six) hours as needed for  headache.   Yes [provider]  Cinnamon 500 MG TABS Take 500 mg by mouth daily.   Yes [provider]  fexofenadine-pseudoephedrine (ALLEGRA-D 24) 180-240 MG per 24 hr tablet Take 1 tablet by mouth daily.   Yes [provider]  finasteride (PROSCAR) 5 MG tablet Take 5 mg by mouth daily. 11/04/15  Yes [provider]  glucosamine-chondroitin 500-400 MG tablet Take 1 tablet by mouth 3 (three) times daily.   Yes [provider]  KRILL OIL PO Take by mouth daily.   Yes [provider]  meloxicam (MOBIC) 15 MG tablet Take 15 mg by mouth daily.   Yes [provider]  Misc Natural Products (OSTEO BI-FLEX ADV JOINT SHIELD PO) Take by mouth daily.   Yes [provider]  Misc Natural Products ([place]) CAPS Take by mouth daily.   Yes [provider]  naproxen sodium (ANAPROX) 220 MG tablet Take 220 mg by mouth 2 (two) times daily with a meal.   Yes  [provider]  Prenatal Vit-Fe Fumarate-FA (M-VIT) tablet Take 1 tablet by mouth daily.   Yes [provider]  Probiotic Product (PROBIOTIC DAILY) CAPS Take by mouth daily.   Yes [provider]  sildenafil (VIAGRA) 100 MG tablet Take 100 mg by mouth daily as needed for erectile dysfunction.   Yes [provider]  tizanidine (ZANAFLEX) 2 MG capsule Take 2 mg by mouth as needed.   Yes [provider]  traMADol (ULTRAM) 50 MG tablet Take by mouth every 6 (six) hours as needed.   Yes [provider]     Positive ROS: Otherwise negative  All other systems have been reviewed and were otherwise negative with the exception of those mentioned in the HPI and as above.  Physical Exam: Constitutional: Alert, well-appearing, no acute distress Ears: External ears without lesions or tenderness.  He had a little bit of wax adjacent to the left TM that was removed in the office today using forceps.  The TM itself was clear with good mobility on pneumatic otoscopy. Audiogram today demonstrated mild to moderate downsloping sensorineural hearing loss in the right ear ranging from 20 DB down to 55 DB in the upper frequencies.  SRT on the right side was 25 DB.  On the left side hearing ranged from 40 DB in the lower frequencies down to 60 DB in the upper frequencies with SRT on the left side of 50 DB.  He had type a tympanograms bilaterally. Nasal: External nose without lesions. Septum mildly deviated with moderate rhinitis.  Both middle meatus regions were clear with no signs of infection. Oral: Lips and gums without lesions. Tongue and palate mucosa without lesions. Posterior oropharynx clear. Neck: No palpable adenopathy or masses.  No carotid bruits noted Respiratory: Breathing comfortably  Skin: No facial/neck lesions or rash noted.  Cerumen impaction removal  Date/Time: 12/04/2019 7:19 PM Performed by: Rozetta Nunnery, MD Authorized by: Rozetta Nunnery, MD   Consent:    Consent obtained:  Verbal   Consent given by:  Patient   Risks discussed:  Pain and bleeding Procedure details:    Location:  L ear   Procedure type: forceps   Post-procedure details:    Inspection:  TM intact and canal normal   Hearing quality:  Improved   Patient tolerance of procedure:  Tolerated well, no immediate complications    Assessment: Sudden left ear SNHL. Chronic rhinitis  Plan: Placed him on a prednisone  taper starting with 60 mg x 3 days followed by 50 mg x 3 days and then tapering from 40 mg x 2 days, 30 mg x 2 days, 20 mg x 2 days, and 10 mg x 2 days. He will follow-up in 2 weeks for recheck and repeat audiogram. Also prescribed Nasacort 2 sprays each nostril at night to help with the chronic rhinitis.   Radene Journey, MD   CC:

## 2019-12-05 DIAGNOSIS — M47896 Other spondylosis, lumbar region: Secondary | ICD-10-CM | POA: Diagnosis not present

## 2019-12-05 DIAGNOSIS — M5416 Radiculopathy, lumbar region: Secondary | ICD-10-CM | POA: Diagnosis not present

## 2019-12-07 ENCOUNTER — Encounter (INDEPENDENT_AMBULATORY_CARE_PROVIDER_SITE_OTHER): Payer: Self-pay

## 2019-12-07 DIAGNOSIS — M47896 Other spondylosis, lumbar region: Secondary | ICD-10-CM | POA: Diagnosis not present

## 2019-12-07 DIAGNOSIS — M5416 Radiculopathy, lumbar region: Secondary | ICD-10-CM | POA: Diagnosis not present

## 2019-12-25 ENCOUNTER — Encounter (INDEPENDENT_AMBULATORY_CARE_PROVIDER_SITE_OTHER): Payer: Self-pay | Admitting: Otolaryngology

## 2019-12-25 ENCOUNTER — Ambulatory Visit (INDEPENDENT_AMBULATORY_CARE_PROVIDER_SITE_OTHER): Payer: Medicare HMO | Admitting: Otolaryngology

## 2019-12-25 ENCOUNTER — Other Ambulatory Visit: Payer: Self-pay

## 2019-12-25 VITALS — Temp 98.4°F

## 2019-12-25 DIAGNOSIS — J310 Chronic rhinitis: Secondary | ICD-10-CM

## 2019-12-25 DIAGNOSIS — H903 Sensorineural hearing loss, bilateral: Secondary | ICD-10-CM

## 2019-12-25 NOTE — Progress Notes (Signed)
HPI: Tanner Hall is a 73 y.o. male who returns today for evaluation of sudden left ear SNHL.  He has completed the steroids.  He presents today to have repeat audiogram. He also complains of chronic postnasal drainage.  He uses Flonase intermittently as well as saline rinse occasionally.  He uses CPAP at night.  Questions whether he has an infection of the sinuses.Marland Kitchen He is hearing better today of the left ear.  Past Medical History:  Diagnosis Date  . AAA (abdominal aortic aneurysm) ([facility])    3.1 cm  . BPH (benign prostatic hyperplasia)   . Diverticulitis   . GI bleed   . Hematuria   . PVD (peripheral vascular disease) ([place])    Past Surgical History:  Procedure Laterality Date  . COLONOSCOPY  06/2011  . CYSTOSCOPY FOR HEMATURIA     NO CANCER FOUND  . DOUBLE INGUINAL REPAIR  1998  . KNEE SURGERY    . 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 PARTIAL COLECTOMY FOR DIVERTICULITIS  10/2006   Social History   Socioeconomic History  . Marital status: Married    Spouse name: Not on file  . Number of children: Not on file  . Years of education: Not on file  . Highest education level: Not on file  Occupational History  . Not on file  Tobacco Use  . Smoking status: Former Smoker    Packs/day: 1.50    Years: 44.00    Pack years: 66.00    Start date: 1963    Quit date: 11/18/2006    Years since quitting: 13.1  . Smokeless tobacco: Never Used  Substance and Sexual Activity  . Alcohol use: Yes  . Drug use: No  . Sexual activity: Not on file  Other Topics Concern  . Not on file  Social History Narrative  . Not on file   Social Determinants of Health   Financial Resource Strain:   . Difficulty of Paying Living Expenses: Not on file  Food Insecurity:   . Worried About Charity fundraiser in the Last Year: Not on file  . Ran Out of Food in the Last Year: Not on file  Transportation Needs:   . Lack of Transportation (Medical): Not on file  .  Lack of Transportation (Non-Medical): Not on file  Physical Activity:   . Days of Exercise per Week: Not on file  . Minutes of Exercise per Session: Not on file  Stress:   . Feeling of Stress : Not on file  Social Connections:   . Frequency of Communication with Friends and Family: Not on file  . Frequency of Social Gatherings with Friends and Family: Not on file  . Attends Religious Services: Not on file  . Active Member of Clubs or Organizations: Not on file  . Attends Archivist Meetings: Not on file  . Marital Status: Not on file   Family History  Problem Relation Age of Onset  . CVA Mother   . Leukemia Father   . Stomach cancer Paternal Grandfather    No Known Allergies Prior to Admission medications   Medication Sig Start Date End Date Taking? Authorizing Provider  aspirin-acetaminophen-caffeine (EXCEDRIN MIGRAINE) [phone] MG per tablet Take 2 tablets by mouth every 6 (six) hours as needed for headache.   Yes [provider]  Cinnamon 500 MG TABS Take 500 mg by mouth daily.   Yes [provider]  fexofenadine-pseudoephedrine (Lianne Moris  24) 180-240 MG per 24 hr tablet Take 1 tablet by mouth daily.   Yes [provider]  finasteride (PROSCAR) 5 MG tablet Take 5 mg by mouth daily. 11/04/15  Yes [provider]  glucosamine-chondroitin 500-400 MG tablet Take 1 tablet by mouth 3 (three) times daily.   Yes [provider]  KRILL OIL PO Take by mouth daily.   Yes [provider]  meloxicam (MOBIC) 15 MG tablet Take 15 mg by mouth daily.   Yes [provider]  Misc Natural Products (OSTEO BI-FLEX ADV JOINT SHIELD PO) Take by mouth daily.   Yes [provider]  Misc Natural Products ([place]) CAPS Take by mouth daily.   Yes [provider]  naproxen sodium (ANAPROX) 220 MG tablet Take 220 mg by mouth 2 (two) times daily with a meal.   Yes [provider]  Prenatal Vit-Fe  Fumarate-FA (M-VIT) tablet Take 1 tablet by mouth daily.   Yes [provider]  Probiotic Product (PROBIOTIC DAILY) CAPS Take by mouth daily.   Yes [provider]  sildenafil (VIAGRA) 100 MG tablet Take 100 mg by mouth daily as needed for erectile dysfunction.   Yes [provider]  tizanidine (ZANAFLEX) 2 MG capsule Take 2 mg by mouth as needed.   Yes [provider]  traMADol (ULTRAM) 50 MG tablet Take by mouth every 6 (six) hours as needed.   Yes [provider]     Positive ROS:  All other systems have been reviewed and were otherwise negative with the exception of those mentioned in the HPI and as above.  Physical Exam: Constitutional: Alert, well-appearing, no acute distress Ears: External ears without lesions or tenderness. Ear canals are clear bilaterally with intact, clear TMs.  Nasal: External nose without lesions. Septum with minimal deformity.  No polyps noted.  Both middle meatus regions are clear with no active mucopurulent discharge noted.  Moderate rhinitis.. Clear nasal passages Oral: Lips and gums without lesions. Tongue and palate mucosa without lesions. Posterior oropharynx clear. Neck: No palpable adenopathy or masses Respiratory: Breathing comfortably  Skin: No facial/neck lesions or rash noted.  Review of the audiogram today demonstrated essentially total resolution of the left ear SNHL.  He still has downsloping upper frequency sensorineural hearing loss which is symmetric in both ears.  The lower frequency sensorineural hearing loss resolved 220 DB 3 1000 frequency on the left side.  Procedures  Assessment: Resolution of sudden left ear SNHL. Presbycusis bilaterally Chronic rhinitis  Plan: Recommended regular use of nasal steroid spray and saline irrigation for the postnasal drainage. Gave him a prescription for Augmentin 875 mg twice daily for 10 days to try if symptoms persist. Concerning his hearing he would be  a candidate for hearing aids if desired but he has essentially total resolution of the left ear sudden SNHL.   Radene Journey, MD

## 2019-12-27 ENCOUNTER — Encounter (INDEPENDENT_AMBULATORY_CARE_PROVIDER_SITE_OTHER): Payer: Self-pay

## 2019-12-27 DIAGNOSIS — M545 Low back pain: Secondary | ICD-10-CM | POA: Diagnosis not present

## 2019-12-27 DIAGNOSIS — M47816 Spondylosis without myelopathy or radiculopathy, lumbar region: Secondary | ICD-10-CM | POA: Diagnosis not present

## 2019-12-27 DIAGNOSIS — M5416 Radiculopathy, lumbar region: Secondary | ICD-10-CM | POA: Diagnosis not present

## 2020-01-02 DIAGNOSIS — M5416 Radiculopathy, lumbar region: Secondary | ICD-10-CM | POA: Diagnosis not present

## 2020-01-02 DIAGNOSIS — M545 Low back pain: Secondary | ICD-10-CM | POA: Diagnosis not present

## 2020-01-17 ENCOUNTER — Ambulatory Visit: Payer: Medicare HMO | Attending: Internal Medicine

## 2020-01-17 DIAGNOSIS — Z23 Encounter for immunization: Secondary | ICD-10-CM

## 2020-01-17 NOTE — Progress Notes (Signed)
   Covid-19 Vaccination Clinic  Name:  Tanner Hall    MRN: [id number] DOB: 04/11/1946  01/17/2020  [name] was observed post Covid-19 immunization for 15 minutes without incidence. He was provided with Vaccine Information Sheet and instruction to access the V-Safe system.   [name] was instructed to call 911 with any severe reactions post vaccine: Marland Kitchen Difficulty breathing  . Swelling of your face and throat  . A fast heartbeat  . A bad rash all over your body  . Dizziness and weakness    Immunizations Administered    Name Date Dose VIS Date Route   Pfizer COVID-19 Vaccine 01/17/2020 10:13 AM 0.3 mL 12/07/2019 Intramuscular   Manufacturer: [place]   Lot: [id number]   [place]: [id number]

## 2020-01-21 DIAGNOSIS — M5416 Radiculopathy, lumbar region: Secondary | ICD-10-CM | POA: Diagnosis not present

## 2020-01-21 DIAGNOSIS — M47896 Other spondylosis, lumbar region: Secondary | ICD-10-CM | POA: Diagnosis not present

## 2020-01-24 DIAGNOSIS — M47896 Other spondylosis, lumbar region: Secondary | ICD-10-CM | POA: Diagnosis not present

## 2020-01-24 DIAGNOSIS — M5416 Radiculopathy, lumbar region: Secondary | ICD-10-CM | POA: Diagnosis not present

## 2020-01-29 DIAGNOSIS — M5416 Radiculopathy, lumbar region: Secondary | ICD-10-CM | POA: Diagnosis not present

## 2020-01-29 DIAGNOSIS — M47896 Other spondylosis, lumbar region: Secondary | ICD-10-CM | POA: Diagnosis not present

## 2020-01-31 DIAGNOSIS — M47896 Other spondylosis, lumbar region: Secondary | ICD-10-CM | POA: Diagnosis not present

## 2020-01-31 DIAGNOSIS — M5416 Radiculopathy, lumbar region: Secondary | ICD-10-CM | POA: Diagnosis not present

## 2020-02-04 DIAGNOSIS — M47896 Other spondylosis, lumbar region: Secondary | ICD-10-CM | POA: Diagnosis not present

## 2020-02-04 DIAGNOSIS — M5416 Radiculopathy, lumbar region: Secondary | ICD-10-CM | POA: Diagnosis not present

## 2020-02-05 DIAGNOSIS — M545 Low back pain: Secondary | ICD-10-CM | POA: Diagnosis not present

## 2020-02-05 DIAGNOSIS — M47816 Spondylosis without myelopathy or radiculopathy, lumbar region: Secondary | ICD-10-CM | POA: Diagnosis not present

## 2020-02-05 DIAGNOSIS — M48061 Spinal stenosis, lumbar region without neurogenic claudication: Secondary | ICD-10-CM | POA: Diagnosis not present

## 2020-02-07 ENCOUNTER — Ambulatory Visit: Payer: Medicare HMO | Attending: Internal Medicine

## 2020-02-07 DIAGNOSIS — Z23 Encounter for immunization: Secondary | ICD-10-CM | POA: Insufficient documentation

## 2020-02-07 NOTE — Progress Notes (Signed)
   Covid-19 Vaccination Clinic  Name:  Tanner Hall    MRN: [id number] DOB: October 12, 1946  02/07/2020  Tanner Hall was observed post Covid-19 immunization for 15 minutes without incidence. He was provided with Vaccine Information Sheet and instruction to access the V-Safe system.   Tanner Hall was instructed to call 911 with any severe reactions post vaccine: Marland Kitchen Difficulty breathing  . Swelling of your face and throat  . A fast heartbeat  . A bad rash all over your body  . Dizziness and weakness    Immunizations Administered    Name Date Dose VIS Date Route   Pfizer COVID-19 Vaccine 02/07/2020 10:56 AM 0.3 mL 12/07/2019 Intramuscular   Manufacturer: [place]   Lot: [id number]   [place]: [id number]

## 2020-02-13 DIAGNOSIS — H5203 Hypermetropia, bilateral: Secondary | ICD-10-CM | POA: Diagnosis not present

## 2020-02-13 DIAGNOSIS — H52209 Unspecified astigmatism, unspecified eye: Secondary | ICD-10-CM | POA: Diagnosis not present

## 2020-02-18 DIAGNOSIS — S29012A Strain of muscle and tendon of back wall of thorax, initial encounter: Secondary | ICD-10-CM | POA: Diagnosis not present

## 2020-02-18 DIAGNOSIS — M9902 Segmental and somatic dysfunction of thoracic region: Secondary | ICD-10-CM | POA: Diagnosis not present

## 2020-02-18 DIAGNOSIS — M5137 Other intervertebral disc degeneration, lumbosacral region: Secondary | ICD-10-CM | POA: Diagnosis not present

## 2020-02-18 DIAGNOSIS — M9903 Segmental and somatic dysfunction of lumbar region: Secondary | ICD-10-CM | POA: Diagnosis not present

## 2020-02-18 DIAGNOSIS — M9905 Segmental and somatic dysfunction of pelvic region: Secondary | ICD-10-CM | POA: Diagnosis not present

## 2020-02-18 DIAGNOSIS — M5417 Radiculopathy, lumbosacral region: Secondary | ICD-10-CM | POA: Diagnosis not present

## 2020-02-20 DIAGNOSIS — M9905 Segmental and somatic dysfunction of pelvic region: Secondary | ICD-10-CM | POA: Diagnosis not present

## 2020-02-20 DIAGNOSIS — M5137 Other intervertebral disc degeneration, lumbosacral region: Secondary | ICD-10-CM | POA: Diagnosis not present

## 2020-02-20 DIAGNOSIS — M9903 Segmental and somatic dysfunction of lumbar region: Secondary | ICD-10-CM | POA: Diagnosis not present

## 2020-02-20 DIAGNOSIS — S29012A Strain of muscle and tendon of back wall of thorax, initial encounter: Secondary | ICD-10-CM | POA: Diagnosis not present

## 2020-02-20 DIAGNOSIS — M5417 Radiculopathy, lumbosacral region: Secondary | ICD-10-CM | POA: Diagnosis not present

## 2020-02-20 DIAGNOSIS — M9902 Segmental and somatic dysfunction of thoracic region: Secondary | ICD-10-CM | POA: Diagnosis not present

## 2020-02-22 DIAGNOSIS — S29012A Strain of muscle and tendon of back wall of thorax, initial encounter: Secondary | ICD-10-CM | POA: Diagnosis not present

## 2020-02-22 DIAGNOSIS — M9905 Segmental and somatic dysfunction of pelvic region: Secondary | ICD-10-CM | POA: Diagnosis not present

## 2020-02-22 DIAGNOSIS — M5417 Radiculopathy, lumbosacral region: Secondary | ICD-10-CM | POA: Diagnosis not present

## 2020-02-22 DIAGNOSIS — M9903 Segmental and somatic dysfunction of lumbar region: Secondary | ICD-10-CM | POA: Diagnosis not present

## 2020-02-22 DIAGNOSIS — M5137 Other intervertebral disc degeneration, lumbosacral region: Secondary | ICD-10-CM | POA: Diagnosis not present

## 2020-02-22 DIAGNOSIS — M9902 Segmental and somatic dysfunction of thoracic region: Secondary | ICD-10-CM | POA: Diagnosis not present

## 2020-02-25 DIAGNOSIS — S29012A Strain of muscle and tendon of back wall of thorax, initial encounter: Secondary | ICD-10-CM | POA: Diagnosis not present

## 2020-02-25 DIAGNOSIS — M9903 Segmental and somatic dysfunction of lumbar region: Secondary | ICD-10-CM | POA: Diagnosis not present

## 2020-02-25 DIAGNOSIS — M9902 Segmental and somatic dysfunction of thoracic region: Secondary | ICD-10-CM | POA: Diagnosis not present

## 2020-02-25 DIAGNOSIS — M5137 Other intervertebral disc degeneration, lumbosacral region: Secondary | ICD-10-CM | POA: Diagnosis not present

## 2020-02-25 DIAGNOSIS — M9905 Segmental and somatic dysfunction of pelvic region: Secondary | ICD-10-CM | POA: Diagnosis not present

## 2020-02-25 DIAGNOSIS — M5417 Radiculopathy, lumbosacral region: Secondary | ICD-10-CM | POA: Diagnosis not present

## 2020-02-27 DIAGNOSIS — M5137 Other intervertebral disc degeneration, lumbosacral region: Secondary | ICD-10-CM | POA: Diagnosis not present

## 2020-02-27 DIAGNOSIS — M9903 Segmental and somatic dysfunction of lumbar region: Secondary | ICD-10-CM | POA: Diagnosis not present

## 2020-02-27 DIAGNOSIS — M5417 Radiculopathy, lumbosacral region: Secondary | ICD-10-CM | POA: Diagnosis not present

## 2020-02-27 DIAGNOSIS — M9905 Segmental and somatic dysfunction of pelvic region: Secondary | ICD-10-CM | POA: Diagnosis not present

## 2020-02-27 DIAGNOSIS — S29012A Strain of muscle and tendon of back wall of thorax, initial encounter: Secondary | ICD-10-CM | POA: Diagnosis not present

## 2020-02-27 DIAGNOSIS — M9902 Segmental and somatic dysfunction of thoracic region: Secondary | ICD-10-CM | POA: Diagnosis not present

## 2020-02-29 DIAGNOSIS — M5417 Radiculopathy, lumbosacral region: Secondary | ICD-10-CM | POA: Diagnosis not present

## 2020-02-29 DIAGNOSIS — M9902 Segmental and somatic dysfunction of thoracic region: Secondary | ICD-10-CM | POA: Diagnosis not present

## 2020-02-29 DIAGNOSIS — M9903 Segmental and somatic dysfunction of lumbar region: Secondary | ICD-10-CM | POA: Diagnosis not present

## 2020-02-29 DIAGNOSIS — M9905 Segmental and somatic dysfunction of pelvic region: Secondary | ICD-10-CM | POA: Diagnosis not present

## 2020-02-29 DIAGNOSIS — S29012A Strain of muscle and tendon of back wall of thorax, initial encounter: Secondary | ICD-10-CM | POA: Diagnosis not present

## 2020-02-29 DIAGNOSIS — M5137 Other intervertebral disc degeneration, lumbosacral region: Secondary | ICD-10-CM | POA: Diagnosis not present

## 2020-03-03 DIAGNOSIS — S29012A Strain of muscle and tendon of back wall of thorax, initial encounter: Secondary | ICD-10-CM | POA: Diagnosis not present

## 2020-03-03 DIAGNOSIS — M9903 Segmental and somatic dysfunction of lumbar region: Secondary | ICD-10-CM | POA: Diagnosis not present

## 2020-03-03 DIAGNOSIS — M5137 Other intervertebral disc degeneration, lumbosacral region: Secondary | ICD-10-CM | POA: Diagnosis not present

## 2020-03-03 DIAGNOSIS — M9902 Segmental and somatic dysfunction of thoracic region: Secondary | ICD-10-CM | POA: Diagnosis not present

## 2020-03-03 DIAGNOSIS — M9905 Segmental and somatic dysfunction of pelvic region: Secondary | ICD-10-CM | POA: Diagnosis not present

## 2020-03-03 DIAGNOSIS — M5417 Radiculopathy, lumbosacral region: Secondary | ICD-10-CM | POA: Diagnosis not present

## 2020-03-05 DIAGNOSIS — S29012A Strain of muscle and tendon of back wall of thorax, initial encounter: Secondary | ICD-10-CM | POA: Diagnosis not present

## 2020-03-05 DIAGNOSIS — M5417 Radiculopathy, lumbosacral region: Secondary | ICD-10-CM | POA: Diagnosis not present

## 2020-03-05 DIAGNOSIS — M5137 Other intervertebral disc degeneration, lumbosacral region: Secondary | ICD-10-CM | POA: Diagnosis not present

## 2020-03-05 DIAGNOSIS — M9903 Segmental and somatic dysfunction of lumbar region: Secondary | ICD-10-CM | POA: Diagnosis not present

## 2020-03-05 DIAGNOSIS — M9905 Segmental and somatic dysfunction of pelvic region: Secondary | ICD-10-CM | POA: Diagnosis not present

## 2020-03-05 DIAGNOSIS — M9902 Segmental and somatic dysfunction of thoracic region: Secondary | ICD-10-CM | POA: Diagnosis not present

## 2020-03-07 DIAGNOSIS — M9905 Segmental and somatic dysfunction of pelvic region: Secondary | ICD-10-CM | POA: Diagnosis not present

## 2020-03-07 DIAGNOSIS — M5137 Other intervertebral disc degeneration, lumbosacral region: Secondary | ICD-10-CM | POA: Diagnosis not present

## 2020-03-07 DIAGNOSIS — M9903 Segmental and somatic dysfunction of lumbar region: Secondary | ICD-10-CM | POA: Diagnosis not present

## 2020-03-07 DIAGNOSIS — M5417 Radiculopathy, lumbosacral region: Secondary | ICD-10-CM | POA: Diagnosis not present

## 2020-03-07 DIAGNOSIS — S29012A Strain of muscle and tendon of back wall of thorax, initial encounter: Secondary | ICD-10-CM | POA: Diagnosis not present

## 2020-03-07 DIAGNOSIS — M9902 Segmental and somatic dysfunction of thoracic region: Secondary | ICD-10-CM | POA: Diagnosis not present

## 2020-03-10 DIAGNOSIS — S29012A Strain of muscle and tendon of back wall of thorax, initial encounter: Secondary | ICD-10-CM | POA: Diagnosis not present

## 2020-03-10 DIAGNOSIS — M9903 Segmental and somatic dysfunction of lumbar region: Secondary | ICD-10-CM | POA: Diagnosis not present

## 2020-03-10 DIAGNOSIS — M9905 Segmental and somatic dysfunction of pelvic region: Secondary | ICD-10-CM | POA: Diagnosis not present

## 2020-03-10 DIAGNOSIS — M5137 Other intervertebral disc degeneration, lumbosacral region: Secondary | ICD-10-CM | POA: Diagnosis not present

## 2020-03-10 DIAGNOSIS — M5417 Radiculopathy, lumbosacral region: Secondary | ICD-10-CM | POA: Diagnosis not present

## 2020-03-10 DIAGNOSIS — M9902 Segmental and somatic dysfunction of thoracic region: Secondary | ICD-10-CM | POA: Diagnosis not present

## 2020-03-12 DIAGNOSIS — M9903 Segmental and somatic dysfunction of lumbar region: Secondary | ICD-10-CM | POA: Diagnosis not present

## 2020-03-12 DIAGNOSIS — M9902 Segmental and somatic dysfunction of thoracic region: Secondary | ICD-10-CM | POA: Diagnosis not present

## 2020-03-12 DIAGNOSIS — S29012A Strain of muscle and tendon of back wall of thorax, initial encounter: Secondary | ICD-10-CM | POA: Diagnosis not present

## 2020-03-12 DIAGNOSIS — M5417 Radiculopathy, lumbosacral region: Secondary | ICD-10-CM | POA: Diagnosis not present

## 2020-03-12 DIAGNOSIS — M5137 Other intervertebral disc degeneration, lumbosacral region: Secondary | ICD-10-CM | POA: Diagnosis not present

## 2020-03-12 DIAGNOSIS — M9905 Segmental and somatic dysfunction of pelvic region: Secondary | ICD-10-CM | POA: Diagnosis not present

## 2020-03-14 DIAGNOSIS — M5417 Radiculopathy, lumbosacral region: Secondary | ICD-10-CM | POA: Diagnosis not present

## 2020-03-14 DIAGNOSIS — M9903 Segmental and somatic dysfunction of lumbar region: Secondary | ICD-10-CM | POA: Diagnosis not present

## 2020-03-14 DIAGNOSIS — M9905 Segmental and somatic dysfunction of pelvic region: Secondary | ICD-10-CM | POA: Diagnosis not present

## 2020-03-14 DIAGNOSIS — S29012A Strain of muscle and tendon of back wall of thorax, initial encounter: Secondary | ICD-10-CM | POA: Diagnosis not present

## 2020-03-14 DIAGNOSIS — M5137 Other intervertebral disc degeneration, lumbosacral region: Secondary | ICD-10-CM | POA: Diagnosis not present

## 2020-03-14 DIAGNOSIS — M9902 Segmental and somatic dysfunction of thoracic region: Secondary | ICD-10-CM | POA: Diagnosis not present

## 2020-03-17 DIAGNOSIS — M9902 Segmental and somatic dysfunction of thoracic region: Secondary | ICD-10-CM | POA: Diagnosis not present

## 2020-03-17 DIAGNOSIS — M5137 Other intervertebral disc degeneration, lumbosacral region: Secondary | ICD-10-CM | POA: Diagnosis not present

## 2020-03-17 DIAGNOSIS — M9905 Segmental and somatic dysfunction of pelvic region: Secondary | ICD-10-CM | POA: Diagnosis not present

## 2020-03-17 DIAGNOSIS — S29012A Strain of muscle and tendon of back wall of thorax, initial encounter: Secondary | ICD-10-CM | POA: Diagnosis not present

## 2020-03-17 DIAGNOSIS — M5417 Radiculopathy, lumbosacral region: Secondary | ICD-10-CM | POA: Diagnosis not present

## 2020-03-17 DIAGNOSIS — M9903 Segmental and somatic dysfunction of lumbar region: Secondary | ICD-10-CM | POA: Diagnosis not present

## 2020-03-19 DIAGNOSIS — M9902 Segmental and somatic dysfunction of thoracic region: Secondary | ICD-10-CM | POA: Diagnosis not present

## 2020-03-19 DIAGNOSIS — M5137 Other intervertebral disc degeneration, lumbosacral region: Secondary | ICD-10-CM | POA: Diagnosis not present

## 2020-03-19 DIAGNOSIS — M5417 Radiculopathy, lumbosacral region: Secondary | ICD-10-CM | POA: Diagnosis not present

## 2020-03-19 DIAGNOSIS — M9903 Segmental and somatic dysfunction of lumbar region: Secondary | ICD-10-CM | POA: Diagnosis not present

## 2020-03-19 DIAGNOSIS — M9905 Segmental and somatic dysfunction of pelvic region: Secondary | ICD-10-CM | POA: Diagnosis not present

## 2020-03-19 DIAGNOSIS — S29012A Strain of muscle and tendon of back wall of thorax, initial encounter: Secondary | ICD-10-CM | POA: Diagnosis not present

## 2020-03-20 DIAGNOSIS — M47816 Spondylosis without myelopathy or radiculopathy, lumbar region: Secondary | ICD-10-CM | POA: Diagnosis not present

## 2020-03-20 DIAGNOSIS — M545 Low back pain: Secondary | ICD-10-CM | POA: Diagnosis not present

## 2020-03-25 ENCOUNTER — Other Ambulatory Visit: Payer: Self-pay | Admitting: Sports Medicine

## 2020-03-25 ENCOUNTER — Encounter: Payer: Self-pay | Admitting: *Deleted

## 2020-03-25 ENCOUNTER — Other Ambulatory Visit: Payer: Self-pay

## 2020-03-25 ENCOUNTER — Encounter: Payer: Self-pay | Admitting: Sports Medicine

## 2020-03-25 ENCOUNTER — Other Ambulatory Visit: Payer: Self-pay | Admitting: *Deleted

## 2020-03-25 ENCOUNTER — Ambulatory Visit (INDEPENDENT_AMBULATORY_CARE_PROVIDER_SITE_OTHER): Payer: Medicare HMO

## 2020-03-25 ENCOUNTER — Ambulatory Visit: Payer: Medicare HMO | Admitting: Sports Medicine

## 2020-03-25 VITALS — BP 158/81 | HR 71

## 2020-03-25 DIAGNOSIS — M9905 Segmental and somatic dysfunction of pelvic region: Secondary | ICD-10-CM | POA: Diagnosis not present

## 2020-03-25 DIAGNOSIS — M9902 Segmental and somatic dysfunction of thoracic region: Secondary | ICD-10-CM | POA: Diagnosis not present

## 2020-03-25 DIAGNOSIS — M779 Enthesopathy, unspecified: Secondary | ICD-10-CM

## 2020-03-25 DIAGNOSIS — M5137 Other intervertebral disc degeneration, lumbosacral region: Secondary | ICD-10-CM | POA: Diagnosis not present

## 2020-03-25 DIAGNOSIS — M79671 Pain in right foot: Secondary | ICD-10-CM | POA: Diagnosis not present

## 2020-03-25 DIAGNOSIS — M722 Plantar fascial fibromatosis: Secondary | ICD-10-CM

## 2020-03-25 DIAGNOSIS — S29012A Strain of muscle and tendon of back wall of thorax, initial encounter: Secondary | ICD-10-CM | POA: Diagnosis not present

## 2020-03-25 DIAGNOSIS — M9903 Segmental and somatic dysfunction of lumbar region: Secondary | ICD-10-CM | POA: Diagnosis not present

## 2020-03-25 DIAGNOSIS — M5417 Radiculopathy, lumbosacral region: Secondary | ICD-10-CM | POA: Diagnosis not present

## 2020-03-25 DIAGNOSIS — M7751 Other enthesopathy of right foot: Secondary | ICD-10-CM | POA: Diagnosis not present

## 2020-03-25 MED ORDER — PREDNISONE 10 MG PO TABS: ORAL_TABLET | ORAL | 0 refills | Status: DC

## 2020-03-25 NOTE — Progress Notes (Signed)
Subjective: DVONTA CALTRIDER is a 74 y.o. male patient who presents to office for evaluation of right foot pain. Patient complains of progressive pain especially over the last few weeks in the right foot at the lateral side reports that the pain radiates all the way up and he has been having problems with his hip and piriformis muscle area and has been seeing orthopedics but lately his foot pain has become much more worse reports that pain is worse with standing and activities especially with first few steps after a period of sitting or when he gets out of bed in the morning.  Reports that he has not tried any treatments besides icing and is not sure what has made it worse reports that he has significant stiffness in the morning in this extremity as well. Patient denies any other pedal complaints. Denies injury/trip/fall/sprain/any causative factors.   Review of Systems  All other systems reviewed and are negative.   Patient Active Problem List   Diagnosis Date Noted  . Bilateral hand pain 07/11/2014  . Bilateral knee pain 07/11/2014  . Low back pain 07/11/2014  . OA (osteoarthritis) 07/11/2014    Current Outpatient Medications on File Prior to Visit  Medication Sig Dispense Refill  . aspirin-acetaminophen-caffeine (EXCEDRIN MIGRAINE) 250-250-65 MG per tablet Take 2 tablets by mouth every 6 (six) hours as needed for headache.    . fexofenadine-pseudoephedrine (ALLEGRA-D 24) 180-240 MG per 24 hr tablet Take 1 tablet by mouth daily.    . finasteride (PROSCAR) 5 MG tablet Take 5 mg by mouth daily.  12  . glucosamine-chondroitin 500-400 MG tablet Take 1 tablet by mouth 3 (three) times daily.    Marland Kitchen KRILL OIL PO Take by mouth daily.    . Misc Natural Products (PROSTATE HEALTH) CAPS Take by mouth daily.    . sildenafil (VIAGRA) 100 MG tablet Take 100 mg by mouth daily as needed for erectile dysfunction.    . traMADol (ULTRAM) 50 MG tablet Take by mouth every 6 (six) hours as needed.     No current  facility-administered medications on file prior to visit.    Allergies  Allergen Reactions  . Dust Mite Extract   . Pollen Extract-Tree Extract [Pollen Extract]     Objective:  General: Alert and oriented x3 in no acute distress  Dermatology: No open lesions bilateral lower extremities, no webspace macerations, no ecchymosis bilateral, all nails x 10 are well manicured.  Vascular: Dorsalis Pedis and Posterior Tibial pedal pulses palpable, Capillary Fill Time 3 seconds,(+) pedal hair growth bilateral, no edema bilateral lower extremities, Temperature gradient within normal limits.  Neurology: Johney Maine sensation intact via light touch bilateral.  Musculoskeletal: Mild tenderness with palpation at right lateral foot especially at the fifth metatarsal base and cuneiform joint at the lateral aspect of the foot that symptomatically radiates up the leg likely along the peroneal tendon course of the right foot and lower extremity, strength within normal limits in all groups bilateral.   Gait: Antalgic gait  Xrays  Right foot   Impression: Mild thickening of the plantar fascia with multiple avulsed osteophytes as well as mild joint space narrowing at the CC joint, no other acute findings.  Assessment and Plan: Problem List Items Addressed This Visit    None    Visit Diagnoses    Tendonitis    -  Primary   Right foot pain           -Complete examination performed -Xrays reviewed -Discussed treatement options for  likely tendinitis of the foot related to issues with his hip and gait disturbance which could have triggered and flared up the right foot and made it more painful -Patient at this time declined steroid injection -Rx prednisone Dosepak to take as instructed -Dispensed ankle gauntlet to strap from medial to lateral to avoid rubbing at the lateral aspect below the foot and to help with controlling any mechanical overuse to this tendon area -Advised good supportive shoes -Continue  with rest ice elevation and topical pain creams or rubs as needed for pain to the area -Patient to return to office in 4 weeks follow-up tendinitis or sooner if condition worsens.  Landis Martins, DPM

## 2020-03-25 NOTE — Progress Notes (Signed)
dg 

## 2020-04-10 ENCOUNTER — Ambulatory Visit: Payer: Medicare HMO | Admitting: Podiatry

## 2020-04-10 DIAGNOSIS — M791 Myalgia, unspecified site: Secondary | ICD-10-CM | POA: Diagnosis not present

## 2020-04-10 DIAGNOSIS — M545 Low back pain: Secondary | ICD-10-CM | POA: Diagnosis not present

## 2020-04-22 ENCOUNTER — Ambulatory Visit: Payer: Medicare HMO | Admitting: Sports Medicine

## 2020-05-06 ENCOUNTER — Other Ambulatory Visit: Payer: Self-pay

## 2020-05-06 ENCOUNTER — Ambulatory Visit: Payer: Medicare HMO | Admitting: Sports Medicine

## 2020-05-06 ENCOUNTER — Encounter: Payer: Self-pay | Admitting: Sports Medicine

## 2020-05-06 DIAGNOSIS — M79671 Pain in right foot: Secondary | ICD-10-CM

## 2020-05-06 DIAGNOSIS — M779 Enthesopathy, unspecified: Secondary | ICD-10-CM | POA: Diagnosis not present

## 2020-05-06 MED ORDER — PREDNISONE 10 MG PO TABS
ORAL_TABLET | ORAL | 0 refills | Status: DC
Start: 2020-05-06 — End: 2021-10-06

## 2020-05-06 NOTE — Progress Notes (Signed)
Subjective: Tanner Hall is a 74 y.o. male patient who returns to office for follow-up evaluation of right foot pain.  Patient reports that the pain has gotten better now 3 out of 10 prednisone helped greatly as well as the brace which he does not wear that often reports that he also saw his back her doctor who gave him an injection at the piriformis area and that seemed to help as well.  Patient denies any changes with medical history or any other pedal complaints at this time.   Patient Active Problem List   Diagnosis Date Noted  . Bilateral hand pain 07/11/2014  . Bilateral knee pain 07/11/2014  . Low back pain 07/11/2014  . OA (osteoarthritis) 07/11/2014    Current Outpatient Medications on File Prior to Visit  Medication Sig Dispense Refill  . aspirin-acetaminophen-caffeine (EXCEDRIN MIGRAINE) 250-250-65 MG per tablet Take 2 tablets by mouth every 6 (six) hours as needed for headache.    . bupivacaine (MARCAINE) 0.5 % injection 1 mL once.    . celecoxib (CELEBREX) 200 MG capsule     . fexofenadine-pseudoephedrine (ALLEGRA-D 24) 180-240 MG per 24 hr tablet Take 1 tablet by mouth daily.    . finasteride (PROSCAR) 5 MG tablet Take 5 mg by mouth daily.  12  . glucosamine-chondroitin 500-400 MG tablet Take 1 tablet by mouth 3 (three) times daily.    Marland Kitchen KRILL OIL PO Take by mouth daily.    . methylPREDNISolone acetate (DEPO-MEDROL) 40 MG/ML injection Inject 1 ml intramuscularly single dose  RT LOW BACK     LOT [id number]     EXP 11/21    . Misc Natural Products (PROSTATE HEALTH) CAPS Take by mouth daily.    . pravastatin (PRAVACHOL) 10 MG tablet     . pregabalin (LYRICA) 75 MG capsule     . sildenafil (VIAGRA) 100 MG tablet Take 100 mg by mouth daily as needed for erectile dysfunction.    Marland Kitchen tiZANidine (ZANAFLEX) 2 MG tablet     . traMADol (ULTRAM) 50 MG tablet Take by mouth every 6 (six) hours as needed.     No current facility-administered medications on file prior to visit.     Allergies  Allergen Reactions  . Dust Mite Extract   . Pollen Extract-Tree Extract [Pollen Extract]     Objective:  General: Alert and oriented x3 in no acute distress  Dermatology: No open lesions bilateral lower extremities, no webspace macerations, no ecchymosis bilateral, all nails x 10 are well manicured.  Vascular: Dorsalis Pedis and Posterior Tibial pedal pulses palpable, Capillary Fill Time 3 seconds,(+) pedal hair growth bilateral, no edema bilateral lower extremities, Temperature gradient within normal limits.  Neurology: Johney Maine sensation intact via light touch bilateral.  Musculoskeletal: No reproducible tenderness with palpation at right lateral foot especially at the fifth metatarsal base and cuneiform joint, much improved from prior, less radiating pain along the lateral leg on the right, strength within normal limits in all groups bilateral.  Assessment and Plan: Problem List Items Addressed This Visit    None    Visit Diagnoses    Tendonitis    -  Primary   Right foot pain           -Complete examination performed -Re-Discussed continued care for tendinitis of the foot related to issues with his hip/low back and gait disturbance -Refill steroid Dosepak to help patient to continue to improve and symptoms -May wean from ankle gauntlet as tolerated -Advised good supportive shoes recommend  over-the-counter super feet orthotics, shoe list provided -Continue with rest ice elevation and topical pain creams or rubs as needed for pain to the area like before -Patient to return to office if pain fails to continue to improve or sooner if condition worsens.  Landis Martins, DPM

## 2020-05-06 NOTE — Patient Instructions (Signed)
For tennis shoes recommend:  Brooks Beast Ascis New balance Saucony Can be purchased at [employer] or [employer]  Vionic  SAS Can be purchased at [employer] or [employer]   For work shoes recommend: Sketchers Work Timberland boots  Can be purchased at a variety of places or [employer]   For casual shoes recommend: Vionic  Can be purchased at [employer] or [employer]   For Over the Counter Orthotics recommend: Power Steps Can be purchased in office/[facility] Superfeet Can be purchased at [employer] or [employer] Airplus Can be purchased at [employer]  

## 2020-05-29 DIAGNOSIS — M545 Low back pain: Secondary | ICD-10-CM | POA: Diagnosis not present

## 2020-07-01 DIAGNOSIS — M545 Low back pain: Secondary | ICD-10-CM | POA: Diagnosis not present

## 2020-07-01 DIAGNOSIS — M791 Myalgia, unspecified site: Secondary | ICD-10-CM | POA: Diagnosis not present

## 2020-07-03 DIAGNOSIS — Z79899 Other long term (current) drug therapy: Secondary | ICD-10-CM | POA: Diagnosis not present

## 2020-07-03 DIAGNOSIS — E7800 Pure hypercholesterolemia, unspecified: Secondary | ICD-10-CM | POA: Diagnosis not present

## 2020-07-03 DIAGNOSIS — R972 Elevated prostate specific antigen [PSA]: Secondary | ICD-10-CM | POA: Diagnosis not present

## 2020-07-03 DIAGNOSIS — E1169 Type 2 diabetes mellitus with other specified complication: Secondary | ICD-10-CM | POA: Diagnosis not present

## 2020-07-07 DIAGNOSIS — E7800 Pure hypercholesterolemia, unspecified: Secondary | ICD-10-CM | POA: Diagnosis not present

## 2020-07-07 DIAGNOSIS — G629 Polyneuropathy, unspecified: Secondary | ICD-10-CM | POA: Diagnosis not present

## 2020-07-07 DIAGNOSIS — M5430 Sciatica, unspecified side: Secondary | ICD-10-CM | POA: Diagnosis not present

## 2020-07-07 DIAGNOSIS — Z0000 Encounter for general adult medical examination without abnormal findings: Secondary | ICD-10-CM | POA: Diagnosis not present

## 2020-07-07 DIAGNOSIS — E1169 Type 2 diabetes mellitus with other specified complication: Secondary | ICD-10-CM | POA: Diagnosis not present

## 2020-07-07 DIAGNOSIS — M549 Dorsalgia, unspecified: Secondary | ICD-10-CM | POA: Diagnosis not present

## 2020-07-07 DIAGNOSIS — R972 Elevated prostate specific antigen [PSA]: Secondary | ICD-10-CM | POA: Diagnosis not present

## 2020-07-07 DIAGNOSIS — Z79899 Other long term (current) drug therapy: Secondary | ICD-10-CM | POA: Diagnosis not present

## 2020-07-24 DIAGNOSIS — L570 Actinic keratosis: Secondary | ICD-10-CM | POA: Diagnosis not present

## 2020-07-24 DIAGNOSIS — L738 Other specified follicular disorders: Secondary | ICD-10-CM | POA: Diagnosis not present

## 2020-07-24 DIAGNOSIS — L720 Epidermal cyst: Secondary | ICD-10-CM | POA: Diagnosis not present

## 2020-07-30 DIAGNOSIS — M791 Myalgia, unspecified site: Secondary | ICD-10-CM | POA: Diagnosis not present

## 2020-07-30 DIAGNOSIS — M545 Low back pain: Secondary | ICD-10-CM | POA: Diagnosis not present

## 2020-07-30 DIAGNOSIS — M47816 Spondylosis without myelopathy or radiculopathy, lumbar region: Secondary | ICD-10-CM | POA: Diagnosis not present

## 2020-09-23 DIAGNOSIS — M545 Low back pain: Secondary | ICD-10-CM | POA: Diagnosis not present

## 2020-09-25 DIAGNOSIS — M48061 Spinal stenosis, lumbar region without neurogenic claudication: Secondary | ICD-10-CM | POA: Diagnosis not present

## 2020-09-25 DIAGNOSIS — M47816 Spondylosis without myelopathy or radiculopathy, lumbar region: Secondary | ICD-10-CM | POA: Diagnosis not present

## 2020-10-07 DIAGNOSIS — G4733 Obstructive sleep apnea (adult) (pediatric): Secondary | ICD-10-CM | POA: Diagnosis not present

## 2020-10-08 DIAGNOSIS — M545 Low back pain, unspecified: Secondary | ICD-10-CM | POA: Diagnosis not present

## 2020-10-08 DIAGNOSIS — M48061 Spinal stenosis, lumbar region without neurogenic claudication: Secondary | ICD-10-CM | POA: Diagnosis not present

## 2020-10-30 DIAGNOSIS — M47816 Spondylosis without myelopathy or radiculopathy, lumbar region: Secondary | ICD-10-CM | POA: Diagnosis not present

## 2020-10-30 DIAGNOSIS — M48061 Spinal stenosis, lumbar region without neurogenic claudication: Secondary | ICD-10-CM | POA: Diagnosis not present

## 2020-10-30 DIAGNOSIS — M545 Low back pain, unspecified: Secondary | ICD-10-CM | POA: Diagnosis not present

## 2020-11-06 DIAGNOSIS — L720 Epidermal cyst: Secondary | ICD-10-CM | POA: Diagnosis not present

## 2020-11-06 DIAGNOSIS — L814 Other melanin hyperpigmentation: Secondary | ICD-10-CM | POA: Diagnosis not present

## 2020-11-06 DIAGNOSIS — L570 Actinic keratosis: Secondary | ICD-10-CM | POA: Diagnosis not present

## 2020-11-06 DIAGNOSIS — L918 Other hypertrophic disorders of the skin: Secondary | ICD-10-CM | POA: Diagnosis not present

## 2020-11-06 DIAGNOSIS — L821 Other seborrheic keratosis: Secondary | ICD-10-CM | POA: Diagnosis not present

## 2020-11-06 DIAGNOSIS — D225 Melanocytic nevi of trunk: Secondary | ICD-10-CM | POA: Diagnosis not present

## 2020-11-17 DIAGNOSIS — G4733 Obstructive sleep apnea (adult) (pediatric): Secondary | ICD-10-CM | POA: Diagnosis not present

## 2020-11-25 DIAGNOSIS — G4733 Obstructive sleep apnea (adult) (pediatric): Secondary | ICD-10-CM | POA: Diagnosis not present

## 2020-11-27 DIAGNOSIS — H5210 Myopia, unspecified eye: Secondary | ICD-10-CM | POA: Diagnosis not present

## 2020-11-28 DIAGNOSIS — H52209 Unspecified astigmatism, unspecified eye: Secondary | ICD-10-CM | POA: Diagnosis not present

## 2020-11-28 DIAGNOSIS — H5203 Hypermetropia, bilateral: Secondary | ICD-10-CM | POA: Diagnosis not present

## 2021-02-04 DIAGNOSIS — M545 Low back pain, unspecified: Secondary | ICD-10-CM | POA: Diagnosis not present

## 2021-02-04 DIAGNOSIS — M48061 Spinal stenosis, lumbar region without neurogenic claudication: Secondary | ICD-10-CM | POA: Diagnosis not present

## 2021-02-04 DIAGNOSIS — M5416 Radiculopathy, lumbar region: Secondary | ICD-10-CM | POA: Diagnosis not present

## 2021-02-11 DIAGNOSIS — R0981 Nasal congestion: Secondary | ICD-10-CM | POA: Diagnosis not present

## 2021-02-11 DIAGNOSIS — R030 Elevated blood-pressure reading, without diagnosis of hypertension: Secondary | ICD-10-CM | POA: Diagnosis not present

## 2021-02-11 DIAGNOSIS — I10 Essential (primary) hypertension: Secondary | ICD-10-CM | POA: Diagnosis not present

## 2021-02-23 DIAGNOSIS — G4733 Obstructive sleep apnea (adult) (pediatric): Secondary | ICD-10-CM | POA: Diagnosis not present

## 2021-02-25 DIAGNOSIS — R0982 Postnasal drip: Secondary | ICD-10-CM | POA: Diagnosis not present

## 2021-02-25 DIAGNOSIS — M549 Dorsalgia, unspecified: Secondary | ICD-10-CM | POA: Diagnosis not present

## 2021-02-25 DIAGNOSIS — G629 Polyneuropathy, unspecified: Secondary | ICD-10-CM | POA: Diagnosis not present

## 2021-02-25 DIAGNOSIS — E1169 Type 2 diabetes mellitus with other specified complication: Secondary | ICD-10-CM | POA: Diagnosis not present

## 2021-02-25 DIAGNOSIS — I10 Essential (primary) hypertension: Secondary | ICD-10-CM | POA: Diagnosis not present

## 2021-03-10 DIAGNOSIS — M47816 Spondylosis without myelopathy or radiculopathy, lumbar region: Secondary | ICD-10-CM | POA: Diagnosis not present

## 2021-03-10 DIAGNOSIS — M48061 Spinal stenosis, lumbar region without neurogenic claudication: Secondary | ICD-10-CM | POA: Diagnosis not present

## 2021-03-10 DIAGNOSIS — M545 Low back pain, unspecified: Secondary | ICD-10-CM | POA: Diagnosis not present

## 2021-03-11 DIAGNOSIS — Z09 Encounter for follow-up examination after completed treatment for conditions other than malignant neoplasm: Secondary | ICD-10-CM | POA: Diagnosis not present

## 2021-03-11 DIAGNOSIS — J329 Chronic sinusitis, unspecified: Secondary | ICD-10-CM | POA: Diagnosis not present

## 2021-03-11 DIAGNOSIS — E1169 Type 2 diabetes mellitus with other specified complication: Secondary | ICD-10-CM | POA: Diagnosis not present

## 2021-03-11 DIAGNOSIS — I10 Essential (primary) hypertension: Secondary | ICD-10-CM | POA: Diagnosis not present

## 2021-03-12 DIAGNOSIS — M545 Low back pain, unspecified: Secondary | ICD-10-CM | POA: Diagnosis not present

## 2021-03-17 DIAGNOSIS — M25562 Pain in left knee: Secondary | ICD-10-CM | POA: Diagnosis not present

## 2021-03-17 DIAGNOSIS — S83512D Sprain of anterior cruciate ligament of left knee, subsequent encounter: Secondary | ICD-10-CM | POA: Diagnosis not present

## 2021-03-17 DIAGNOSIS — M545 Low back pain, unspecified: Secondary | ICD-10-CM | POA: Diagnosis not present

## 2021-03-19 DIAGNOSIS — S83512D Sprain of anterior cruciate ligament of left knee, subsequent encounter: Secondary | ICD-10-CM | POA: Diagnosis not present

## 2021-03-19 DIAGNOSIS — M545 Low back pain, unspecified: Secondary | ICD-10-CM | POA: Diagnosis not present

## 2021-03-19 DIAGNOSIS — M25562 Pain in left knee: Secondary | ICD-10-CM | POA: Diagnosis not present

## 2021-03-23 DIAGNOSIS — M545 Low back pain, unspecified: Secondary | ICD-10-CM | POA: Diagnosis not present

## 2021-03-25 DIAGNOSIS — M545 Low back pain, unspecified: Secondary | ICD-10-CM | POA: Diagnosis not present

## 2021-03-30 DIAGNOSIS — M545 Low back pain, unspecified: Secondary | ICD-10-CM | POA: Diagnosis not present

## 2021-04-01 DIAGNOSIS — M545 Low back pain, unspecified: Secondary | ICD-10-CM | POA: Diagnosis not present

## 2021-04-06 DIAGNOSIS — M545 Low back pain, unspecified: Secondary | ICD-10-CM | POA: Diagnosis not present

## 2021-04-08 DIAGNOSIS — M545 Low back pain, unspecified: Secondary | ICD-10-CM | POA: Diagnosis not present

## 2021-04-13 DIAGNOSIS — M545 Low back pain, unspecified: Secondary | ICD-10-CM | POA: Diagnosis not present

## 2021-04-15 ENCOUNTER — Other Ambulatory Visit: Payer: Self-pay

## 2021-04-15 ENCOUNTER — Encounter: Payer: Self-pay | Admitting: Dietician

## 2021-04-15 ENCOUNTER — Encounter: Payer: Medicare HMO | Attending: Family Medicine | Admitting: Dietician

## 2021-04-15 VITALS — Ht 69.5 in | Wt 265.1 lb

## 2021-04-15 DIAGNOSIS — E119 Type 2 diabetes mellitus without complications: Secondary | ICD-10-CM | POA: Insufficient documentation

## 2021-04-15 NOTE — Patient Instructions (Addendum)
Begin taking your daily multivitamin again.  Aim for a minimum of 64 oz of water daily.  Work towards consuming less than 2,000 mg each day.  Consider some seated exercises using your arms and shoulders at home to increase your physical activity. Go back to [employer] and work up towards walking two miles on the treadmill AT [place].  When you have wine, have no more than 1-2 5oz glasses of wine per day.   Work towards eating three meals a day, about 5-6 hours apart!  Begin to recognize carbohydrates in your food choices!  Begin to build your meals using the proportions of the Balanced Plate. . First, select your carb choice(s) for the meal. . Next, select your source of protein to pair with your carb choice(s). . Finally, complete the remaining half of your meal with a variety of non-starchy vegetables.

## 2021-04-15 NOTE — Progress Notes (Signed)
Diabetes Self-Management Education  Visit Type: First/Initial  Appt. Start Time: 1520 Appt. End Time: 1630  04/15/2021  [name], identified by name and date of birth, is a 75 y.o. male with a diagnosis of Diabetes: Type 2.   ASSESSMENT Pt reports being their normal weight from high school most of their adult life, then had diverticulitis surgery (12-18 inch bowel resection) 14 years ago which they believe changed their metabolism. Pt gains weight much more easily now. Pt quit smoking at this time as well. Pt reports averaging ~5,000 steps a day, sometimes will do 10,000 in a day. 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 Pt reports back pain limits them from being physically active, currently in physical therapy and gets steroid shots in their back as needed. Pt is also "dry needling" for therapy as well. Pt has a Higher education careers adviser at [employer]. Pt has OSA and uses CPAP. Pt reports having a lot of congestion and phlegm after taking off their CPAP mask in the morning. Pt doctor put them on 2 antibiotics for this, and symptoms have improved. Pt reports decreased hearing previously as well that was rectified with antibiotics. Pt suspects recurring sinus infection. Pt snacks on fruits like bananas, pears, apples, oranges instead of potato chips. Pt states they are not a junkfood type of person. Pt occasionally skips meals. Pt wife is a diabetic and has been working on improving their blood sugar.   Height 5' 9.5" (1.765 m), weight 265 lb 1.6 oz (120.2 kg). Body mass index is 38.59 kg/m.   Diabetes Self-Management Education - 04/15/21 1530      Visit Information   Visit Type First/Initial      Initial Visit   Diabetes Type Type 2    Are you currently following a meal plan? No    Are you taking your medications as prescribed? Not on Medications    Date Diagnosed March, 2022      Health Coping   How would you rate your overall health? Good      Psychosocial Assessment    Patient Belief/Attitude about Diabetes Motivated to manage diabetes    Self-care barriers None    Self-management support Doctor's office    Other persons present Patient    Patient Concerns Nutrition/Meal planning;Healthy Lifestyle;Weight Control    Special Needs None    Preferred Learning Style No preference indicated    Learning Readiness Ready    How often do you need to have someone help you when you read instructions, pamphlets, or other written materials from your doctor or pharmacy? 1 - Never    What is the last grade level you completed in school? College Graduate      Pre-Education Assessment   Patient understands the diabetes disease and treatment process. Needs Instruction    Patient understands incorporating nutritional management into lifestyle. Needs Instruction    Patient undertands incorporating physical activity into lifestyle. Needs Instruction    Patient understands using medications safely. Needs Instruction    Patient understands monitoring blood glucose, interpreting and using results Needs Instruction    Patient understands prevention, detection, and treatment of acute complications. Needs Instruction    Patient understands prevention, detection, and treatment of chronic complications. Needs Instruction    Patient understands how to develop strategies to address psychosocial issues. Needs Instruction    Patient understands how to develop strategies to promote health/change behavior. Needs Instruction      Complications   Last HgB A1C per patient/outside source  6.7 %   02/26/2021   How often do you check your blood sugar? 0 times/day (not testing)    Have you had a dilated eye exam in the past 12 months? Yes    Have you had a dental exam in the past 12 months? Yes    Are you checking your feet? No      Dietary Intake   Breakfast banana, orange, 1.5 cups of coffee with sugar-free creamer.    Snack (morning) none    Lunch 2 hot dogs, Gatorade Zero, Kettle chips     Snack (afternoon) String cheese, glass of wine    Dinner Sausage and beef lasagna, salad w/ oil and vinegar, glass of wine, 2 scoops of vanilla ice cream and chocolate syrup    Snack (evening) 2 string cheeses, small orange, gatorade zero    Beverage(s) wine, coffee, Gatorade      Exercise   Exercise Type ADL's;Light (walking / raking leaves)    How many days per week to you exercise? 0   ~5,000 steps a day   How many minutes per day do you exercise? 0    Total minutes per week of exercise 0      Patient Education   Previous Diabetes Education No    Disease state  Definition of diabetes, type 1 and 2, and the diagnosis of diabetes;Factors that contribute to the development of diabetes    Physical activity and exercise  Role of exercise on diabetes management, blood pressure control and cardiac health.    Chronic complications Relationship between chronic complications and blood glucose control;Lipid levels, blood glucose control and heart disease    Psychosocial adjustment Role of stress on diabetes;Identified and addressed patients feelings and concerns about diabetes    Personal strategies to promote health Lifestyle issues that need to be addressed for better diabetes care      Individualized Goals (developed by patient)   Nutrition Follow meal plan discussed    Physical Activity Exercise 3-5 times per week    Medications Not Applicable    Monitoring  Not Applicable      Post-Education Assessment   Patient understands the diabetes disease and treatment process. Needs Review    Patient understands incorporating nutritional management into lifestyle. Needs Review    Patient undertands incorporating physical activity into lifestyle. Needs Review    Patient understands prevention, detection, and treatment of chronic complications. Needs Review    Patient understands how to develop strategies to address psychosocial issues. Needs Review    Patient understands how to develop  strategies to promote health/change behavior. Needs Review      Outcomes   Expected Outcomes Demonstrated interest in learning. Expect positive outcomes    Future DMSE 4-6 wks    Program Status Not Completed           Individualized Plan for Diabetes Self-Management Training:   Learning Objective:  Patient will have a greater understanding of diabetes self-management. Patient education plan is to attend individual and/or group sessions per assessed needs and concerns.   Plan:   Patient Instructions  Begin taking your daily multivitamin again.  Aim for a minimum of 64 oz of water daily.  Work towards consuming less than 2,000 mg each day.  Consider some seated exercises using your arms and shoulders at home to increase your physical activity. Go back to [employer] and work up towards walking two miles on the treadmill AT [place].  When you have wine, have  no more than 1-2 5oz glasses of wine per day.   Work towards eating three meals a day, about 5-6 hours apart!  Begin to recognize carbohydrates in your food choices!  Begin to build your meals using the proportions of the Balanced Plate. . First, select your carb choice(s) for the meal. . Next, select your source of protein to pair with your carb choice(s). . Finally, complete the remaining half of your meal with a variety of non-starchy vegetables.    Expected Outcomes:  Demonstrated interest in learning. Expect positive outcomes  Education material provided: My Plate and Snack sheet  If problems or questions, patient to contact team via:  Phone and Email  Future DSME appointment: 4-6 wks

## 2021-04-17 DIAGNOSIS — M545 Low back pain, unspecified: Secondary | ICD-10-CM | POA: Diagnosis not present

## 2021-04-20 DIAGNOSIS — M545 Low back pain, unspecified: Secondary | ICD-10-CM | POA: Diagnosis not present

## 2021-04-22 DIAGNOSIS — M545 Low back pain, unspecified: Secondary | ICD-10-CM | POA: Diagnosis not present

## 2021-04-27 DIAGNOSIS — M545 Low back pain, unspecified: Secondary | ICD-10-CM | POA: Diagnosis not present

## 2021-04-29 DIAGNOSIS — M545 Low back pain, unspecified: Secondary | ICD-10-CM | POA: Diagnosis not present

## 2021-05-04 DIAGNOSIS — M545 Low back pain, unspecified: Secondary | ICD-10-CM | POA: Diagnosis not present

## 2021-05-08 DIAGNOSIS — Z20822 Contact with and (suspected) exposure to covid-19: Secondary | ICD-10-CM | POA: Diagnosis not present

## 2021-05-09 DIAGNOSIS — U071 COVID-19: Secondary | ICD-10-CM | POA: Diagnosis not present

## 2021-05-18 DIAGNOSIS — M545 Low back pain, unspecified: Secondary | ICD-10-CM | POA: Diagnosis not present

## 2021-05-21 DIAGNOSIS — M545 Low back pain, unspecified: Secondary | ICD-10-CM | POA: Diagnosis not present

## 2021-05-26 DIAGNOSIS — G4733 Obstructive sleep apnea (adult) (pediatric): Secondary | ICD-10-CM | POA: Diagnosis not present

## 2021-05-27 ENCOUNTER — Other Ambulatory Visit: Payer: Self-pay

## 2021-05-27 ENCOUNTER — Encounter: Payer: Medicare HMO | Attending: Family Medicine | Admitting: Dietician

## 2021-05-27 ENCOUNTER — Encounter: Payer: Self-pay | Admitting: Dietician

## 2021-05-27 VITALS — Ht 69.5 in | Wt 263.8 lb

## 2021-05-27 DIAGNOSIS — E119 Type 2 diabetes mellitus without complications: Secondary | ICD-10-CM | POA: Diagnosis not present

## 2021-05-27 NOTE — Progress Notes (Signed)
Diabetes Self-Management Education  Visit Type: Follow-up  Appt. Start Time: 1515 Appt. End Time: 1600  05/27/2021  [name], identified by name and date of birth, is a 75 y.o. male with a diagnosis of Diabetes: Type 2.   ASSESSMENT  Height 5' 9.5" (1.765 m), weight 263 lb 12.8 oz (119.7 kg). Body mass index is 38.4 kg/m.  Pt reports trying to cut down on meal portions and snacks.  Pt has reduced their alcohol consumption, and is increasing their water intake. Pt equates their dietary habits to their impulses they has as a former smoker. May eat impulsively at times, and over consume before they realize it. Pt hasn't been able to play golf in over two years, which is their favorite form of physical activity. Pt is getting around 4,000 steps a day currently. Pt states they get intense back pain when walking up a hill. Pt is in physical therapy twice a week for their back, states the dry needling therapy and stretching has improved back pain. Pt has reduced their sodium consumption, doesn't salt their foods as much. Pt checks BP every morning, averages 140/75. Pt wants to avoid having to check their BG, doesn't want to have to prick themselves.   Diabetes Self-Management Education - 05/27/21 1558      Visit Information   Visit Type Follow-up      Initial Visit   Diabetes Type Type 2      Complications   How often do you check your blood sugar? 0 times/day (not testing)      Exercise   Exercise Type ADL's;Light (walking / raking leaves)      Patient Self-Evaluation of Goals - Patient rates self as meeting previously set goals (% of time)   Nutrition 25 - 50%    Physical Activity < 25%    Medications Not Applicable    Monitoring Not Applicable    Problem Solving 25 - 50%    Reducing Risk 25 - 50%    Health Coping < 25%      Post-Education Assessment   Patient understands the diabetes disease and treatment process. Needs Review    Patient understands incorporating  nutritional management into lifestyle. Needs Review    Patient undertands incorporating physical activity into lifestyle. Needs Review    Patient understands using medications safely. Needs Review    Patient understands monitoring blood glucose, interpreting and using results Needs Review    Patient understands prevention, detection, and treatment of acute complications. Needs Review    Patient understands prevention, detection, and treatment of chronic complications. Needs Review    Patient understands how to develop strategies to address psychosocial issues. Needs Review    Patient understands how to develop strategies to promote health/change behavior. Needs Review      Outcomes   Expected Outcomes Demonstrated interest in learning. Expect positive outcomes    Future DMSE 2 months    Program Status Not Completed      Subsequent Visit   Since your last visit have you continued or begun to take your medications as prescribed? Not on Medications    Since your last visit have you had your blood pressure checked? Yes    Is your most recent blood pressure lower, unchanged, or higher since your last visit? Unchanged    Since your last visit have you experienced any weight changes? Loss    Weight Loss (lbs) 2    Since your last visit, are you checking your blood glucose at  least once a day? No           Individualized Plan for Diabetes Self-Management Training:   Learning Objective:  Patient will have a greater understanding of diabetes self-management. Patient education plan is to attend individual and/or group sessions per assessed needs and concerns.   Plan:   Patient Instructions  Keep up the good work limiting portions, alcohol, and sodium consumption.  Work towards eating three meals a day, about 5-6 hours apart!  Have 3 carb choices at each meal (45 g).   Begin to build your meals using the proportions of the Balanced Plate. . First, select your carb choice(s) for the meal,  and determine how much you should have to equal 3 carb choices (45 g). . Next, select your source of protein to pair with your carb choice(s). . Finally, complete the remaining half of your meal with a variety of non-starchy vegetables.  If you have a snack, make it no more than 1 carb choice (15g) paired with a protein (7-8g). Use your "Balanced Snack" sheet for helpful ideas on snack choices.  Get in some more activity by walking around stores like [employer], [employer], [employer], or [employer].   Expected Outcomes:  Demonstrated interest in learning. Expect positive outcomes  Education material provided: Meal plan card and Snack sheet  If problems or questions, patient to contact team via:  Phone and Email  Future DSME appointment: 2 months

## 2021-05-27 NOTE — Patient Instructions (Signed)
Keep up the good work limiting portions, alcohol, and sodium consumption.  Work towards eating three meals a day, about 5-6 hours apart!  Have 3 carb choices at each meal (45 g).   Begin to build your meals using the proportions of the Balanced Plate. . First, select your carb choice(s) for the meal, and determine how much you should have to equal 3 carb choices (45 g). . Next, select your source of protein to pair with your carb choice(s). . Finally, complete the remaining half of your meal with a variety of non-starchy vegetables.  If you have a snack, make it no more than 1 carb choice (15g) paired with a protein (7-8g). Use your "Balanced Snack" sheet for helpful ideas on snack choices.  Get in some more activity by walking around stores like [employer], [employer], [employer], or [employer].

## 2021-06-03 DIAGNOSIS — M545 Low back pain, unspecified: Secondary | ICD-10-CM | POA: Diagnosis not present

## 2021-06-10 DIAGNOSIS — M545 Low back pain, unspecified: Secondary | ICD-10-CM | POA: Diagnosis not present

## 2021-06-24 DIAGNOSIS — M545 Low back pain, unspecified: Secondary | ICD-10-CM | POA: Diagnosis not present

## 2021-06-26 DIAGNOSIS — M545 Low back pain, unspecified: Secondary | ICD-10-CM | POA: Diagnosis not present

## 2021-07-06 DIAGNOSIS — E1169 Type 2 diabetes mellitus with other specified complication: Secondary | ICD-10-CM | POA: Diagnosis not present

## 2021-07-06 DIAGNOSIS — M5430 Sciatica, unspecified side: Secondary | ICD-10-CM | POA: Diagnosis not present

## 2021-07-06 DIAGNOSIS — R030 Elevated blood-pressure reading, without diagnosis of hypertension: Secondary | ICD-10-CM | POA: Diagnosis not present

## 2021-07-06 DIAGNOSIS — Z0000 Encounter for general adult medical examination without abnormal findings: Secondary | ICD-10-CM | POA: Diagnosis not present

## 2021-07-06 DIAGNOSIS — G629 Polyneuropathy, unspecified: Secondary | ICD-10-CM | POA: Diagnosis not present

## 2021-07-06 DIAGNOSIS — I10 Essential (primary) hypertension: Secondary | ICD-10-CM | POA: Diagnosis not present

## 2021-07-06 DIAGNOSIS — R972 Elevated prostate specific antigen [PSA]: Secondary | ICD-10-CM | POA: Diagnosis not present

## 2021-07-06 DIAGNOSIS — M545 Low back pain, unspecified: Secondary | ICD-10-CM | POA: Diagnosis not present

## 2021-07-06 DIAGNOSIS — M549 Dorsalgia, unspecified: Secondary | ICD-10-CM | POA: Diagnosis not present

## 2021-07-08 DIAGNOSIS — E1169 Type 2 diabetes mellitus with other specified complication: Secondary | ICD-10-CM | POA: Diagnosis not present

## 2021-07-08 DIAGNOSIS — E7800 Pure hypercholesterolemia, unspecified: Secondary | ICD-10-CM | POA: Diagnosis not present

## 2021-07-08 DIAGNOSIS — Z0000 Encounter for general adult medical examination without abnormal findings: Secondary | ICD-10-CM | POA: Diagnosis not present

## 2021-07-08 DIAGNOSIS — R0981 Nasal congestion: Secondary | ICD-10-CM | POA: Diagnosis not present

## 2021-07-08 DIAGNOSIS — M5430 Sciatica, unspecified side: Secondary | ICD-10-CM | POA: Diagnosis not present

## 2021-07-08 DIAGNOSIS — R202 Paresthesia of skin: Secondary | ICD-10-CM | POA: Diagnosis not present

## 2021-07-08 DIAGNOSIS — I10 Essential (primary) hypertension: Secondary | ICD-10-CM | POA: Diagnosis not present

## 2021-07-08 DIAGNOSIS — M545 Low back pain, unspecified: Secondary | ICD-10-CM | POA: Diagnosis not present

## 2021-07-13 DIAGNOSIS — M545 Low back pain, unspecified: Secondary | ICD-10-CM | POA: Diagnosis not present

## 2021-07-22 DIAGNOSIS — M545 Low back pain, unspecified: Secondary | ICD-10-CM | POA: Diagnosis not present

## 2021-07-29 ENCOUNTER — Encounter: Payer: Medicare HMO | Admitting: Dietician

## 2021-07-29 DIAGNOSIS — M545 Low back pain, unspecified: Secondary | ICD-10-CM | POA: Diagnosis not present

## 2021-08-05 DIAGNOSIS — M545 Low back pain, unspecified: Secondary | ICD-10-CM | POA: Diagnosis not present

## 2021-08-12 DIAGNOSIS — M545 Low back pain, unspecified: Secondary | ICD-10-CM | POA: Diagnosis not present

## 2021-08-20 DIAGNOSIS — Z1159 Encounter for screening for other viral diseases: Secondary | ICD-10-CM | POA: Diagnosis not present

## 2021-08-25 DIAGNOSIS — J019 Acute sinusitis, unspecified: Secondary | ICD-10-CM | POA: Diagnosis not present

## 2021-08-26 DIAGNOSIS — M545 Low back pain, unspecified: Secondary | ICD-10-CM | POA: Diagnosis not present

## 2021-09-09 DIAGNOSIS — M545 Low back pain, unspecified: Secondary | ICD-10-CM | POA: Diagnosis not present

## 2021-09-17 ENCOUNTER — Ambulatory Visit: Payer: Medicare HMO | Admitting: Dietician

## 2021-10-05 ENCOUNTER — Ambulatory Visit: Payer: Medicare HMO | Admitting: Diagnostic Neuroimaging

## 2021-10-06 ENCOUNTER — Ambulatory Visit: Payer: Medicare HMO | Admitting: Neurology

## 2021-10-06 ENCOUNTER — Encounter: Payer: Self-pay | Admitting: Neurology

## 2021-10-06 VITALS — BP 134/72 | HR 62 | Ht 69.0 in | Wt 259.5 lb

## 2021-10-06 DIAGNOSIS — M48061 Spinal stenosis, lumbar region without neurogenic claudication: Secondary | ICD-10-CM | POA: Diagnosis not present

## 2021-10-06 DIAGNOSIS — M5136 Other intervertebral disc degeneration, lumbar region: Secondary | ICD-10-CM | POA: Diagnosis not present

## 2021-10-06 DIAGNOSIS — R202 Paresthesia of skin: Secondary | ICD-10-CM | POA: Diagnosis not present

## 2021-10-06 DIAGNOSIS — R200 Anesthesia of skin: Secondary | ICD-10-CM | POA: Diagnosis not present

## 2021-10-06 DIAGNOSIS — G629 Polyneuropathy, unspecified: Secondary | ICD-10-CM

## 2021-10-06 NOTE — Progress Notes (Signed)
[facility]  PATIENT: Tanner Hall DOB: 05-Mar-1946  REFERRING DOCTOR OR PCP: Lawerance Cruel, MD SOURCE: Patient, notes from Dr. Harrington Challenger, lab reports, imaging reports, MRI images personally reviewed.  _________________________________   HISTORICAL  CHIEF COMPLAINT:  Chief Complaint  Patient presents with   New Patient (Initial Visit)    Rm 2, alone. Paper referral for progressive numbness in L arm and bottom of both feet. Pin/needles sensation at time, ongoing for the last year.     HISTORY OF PRESENT ILLNESS:  I had the pleasure of seeing your patient, Tanner Hall, at [facility] for neurologic consultation regarding numbness in the left arm and both feet.  He is a 75 year old man with slowly progressive numbness in his feet and now the left hand starting around mid-2021.    He notes numbness as well as a pins/needles paresthesia.   He notes it more in the morning after waking up.  During the day he is very active (> 10,000 steps a day).  He walks better on flat surface but has more back pain walking uphill or playing golf.       He has known lumbar DJD.  He does have some right buttock pain  with radiation (sciatica) into the right ankle at times.  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    He had ESI at [facility] and had benefit short term.    PT with dry needling also gave some benefit  He does not have diabetes mellitus.  Imaging personally reviewed: MRI of the lumbar spine 09/23/2020 and MRI 10/27/2015 showed: At L2-L3, there is mild spinal stenosis due to disc bulging, mild facet hypertrophy and increased epidural fat.  There is minimal foraminal and lateral recess stenosis but no nerve root compression.  At L3-L4, there is mild spinal stenosis due to disc bulging, facet hypertrophy and increased epidural fat.  There is mild left and minimal right foraminal narrowing but no nerve root compression.  At L4-L5, there is  mild to moderate spinal stenosis due to disc protrusion, endplate spurring, facet hypertrophy and increased epidural fat.  There is left greater than right foraminal narrowing and lateral recess stenosis bilaterally that could affect the L5 nerve roots.  At L5-S1, there is disc protrusion towards the right, endplate spurring and facet hypertrophy causing moderately severe foraminal narrowing with potential for L5 nerve root compression.  Only minimal changes from 2016-2021.  REVIEW OF SYSTEMS: Constitutional: No fevers, chills, sweats, or change in appetite Eyes: No visual changes, double vision, eye pain Ear, nose and throat: No hearing loss, ear pain, nasal congestion, sore throat Cardiovascular: No chest pain, palpitations Respiratory:  No shortness of breath at rest or with exertion.   No wheezes GastrointestinaI: No nausea, vomiting, diarrhea, abdominal pain, fecal incontinence Genitourinary:  No dysuria, urinary retention or frequency.  No nocturia. Musculoskeletal: As above:  Integumentary: No rash, pruritus, skin lesions Neurological: as above Psychiatric: No depression at this time.  No anxiety Endocrine: No palpitations, diaphoresis, change in appetite, change in weigh or increased thirst Hematologic/Lymphatic:  No anemia, purpura, petechiae. Allergic/Immunologic: No itchy/runny eyes, nasal congestion, recent allergic reactions, rashes  ALLERGIES: Allergies  Allergen Reactions   Dust Mite Extract    Pollen Extract-Tree Extract [Pollen Extract]     HOME MEDICATIONS:  Current Outpatient Medications:    amLODipine (NORVASC) 2.5 MG tablet, , Disp: , Rfl:    aspirin-acetaminophen-caffeine (EXCEDRIN MIGRAINE) 250-250-65 MG per tablet,  Take 2 tablets by mouth every 6 (six) hours as needed for headache., Disp: , Rfl:    celecoxib (CELEBREX) 200 MG capsule, Take 200 mg by mouth 2 (two) times daily., Disp: , Rfl:    fexofenadine-pseudoephedrine (ALLEGRA-D 24) 180-240 MG per 24 hr  tablet, Take 1 tablet by mouth as needed. Alternates with other allergy meds, Disp: , Rfl:    finasteride (PROSCAR) 5 MG tablet, Take 5 mg by mouth daily., Disp: , Rfl: 12   finasteride (PROSCAR) 5 MG tablet, 1 tablet, Disp: , Rfl:    irbesartan (AVAPRO) 300 MG tablet, , Disp: , Rfl:    pravastatin (PRAVACHOL) 10 MG tablet, Take 10 mg by mouth once a week., Disp: , Rfl:    pregabalin (LYRICA) 75 MG capsule, Take 75 mg by mouth 2 (two) times daily., Disp: , Rfl:    sildenafil (VIAGRA) 100 MG tablet, Take 100 mg by mouth daily as needed for erectile dysfunction., Disp: , Rfl:    tiZANidine (ZANAFLEX) 2 MG tablet, Take 4 mg by mouth at bedtime., Disp: , Rfl:    traMADol (ULTRAM) 50 MG tablet, Take by mouth every 6 (six) hours as needed., Disp: , Rfl:   PAST MEDICAL HISTORY: Past Medical History:  Diagnosis Date   AAA (abdominal aortic aneurysm)    3.1 cm   BPH (benign prostatic hyperplasia)    Diverticulitis    GI bleed    Hematuria    PVD (peripheral vascular disease) (HCC)     PAST SURGICAL HISTORY: Past Surgical History:  Procedure Laterality Date   COLONOSCOPY  06/2011   CYSTOSCOPY FOR HEMATURIA     NO CANCER FOUND   DOUBLE INGUINAL REPAIR  1998   KNEE SURGERY     LT PAROTID MASS  05/2013   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   PARTIAL COLECTOMY FOR DIVERTICULITIS  10/2006    FAMILY HISTORY: Family History  Problem Relation Age of Onset   CVA Mother    Leukemia Father    Stomach cancer Paternal Grandfather     SOCIAL HISTORY:  Social History   Socioeconomic History   Marital status: Married    Spouse name: Roxanne   Number of children: 1   Years of education: Not on file   Highest education level: Bachelor's degree (e.g., BA, AB, BS)  Occupational History   Not on file  Tobacco Use   Smoking status: Former    Packs/day: 1.50    Years: 44.00    Pack years: 66.00    Types: Cigarettes    Start date: 1963    Quit date: 11/18/2006    Years since  quitting: 14.8   Smokeless tobacco: Never  Substance and Sexual Activity   Alcohol use: Yes    Alcohol/week: 12.0 - 13.0 standard drinks    Types: 8 Glasses of wine, 3 Cans of beer, 1 - 2 Shots of liquor per week   Drug use: No   Sexual activity: Not on file  Other Topics Concern   Not on file  Social History Narrative   Educational degree   Live with wife and Son   Right handed   Caffeine: 1 cup of coffee a day   Social Determinants of Health   Financial Resource Strain: Not on file  Food Insecurity: Not on file  Transportation Needs: Not on file  Physical Activity: Not on file  Stress: Not on file  Social Connections: Not on file  Intimate Partner Violence: Not on file  PHYSICAL EXAM  Vitals:   10/06/21 1014  BP: 134/72  Pulse: 62  Weight: 259 lb 8 oz (117.7 kg)  Height: 5' 9"  (1.753 m)    Body mass index is 38.32 kg/m.   General: The patient is well-developed and well-nourished and in no acute distress  HEENT:  Head is [place]/AT.  Sclera are anicteric.  Funduscopic exam shows normal optic discs and retinal vessels.  Neck: No carotid bruits are noted.  The neck is nontender.  Cardiovascular: The heart has a regular rate and rhythm with a normal S1 and S2. There were no murmurs, gallops or rubs.    Skin: Extremities are without rash or  edema.  Musculoskeletal:  Back is nontender  Neurologic Exam  Mental status: The patient is alert and oriented x 3 at the time of the examination. The patient has apparent normal recent and remote memory, with an apparently normal attention span and concentration ability.   Speech is normal.  Cranial nerves: Extraocular movements are full. Pupils are equal, round, and reactive to light and accomodation.  Visual fields are full.  Facial symmetry is present. There is good facial sensation to soft touch bilaterally.Facial strength is normal.  Trapezius and sternocleidomastoid strength is normal. No dysarthria is noted.  The  tongue is midline, and the patient has symmetric elevation of the soft palate. No obvious hearing deficits are noted.  Motor:  Muscle bulk is normal.   Tone is normal. Strength is  5 / 5 in all 4 extremities.   Sensory: Vibration sensation was 90% at the ankles and 20% at the toes compared to the knees.  Pinprick sensation was fairly normal in the feet.  Sensation was normal to touch and vibration in the arms.  Coordination: Cerebellar testing reveals good finger-nose-finger and heel-to-shin bilaterally.  Gait and station: Station is normal.   Gait is normal. Tandem gait is normal. Romberg is negative.   Reflexes: Deep tendon reflexes are symmetric and normal in the arms, 1 at the knees and trace at the ankles.   Plantar responses are flexor.     ASSESSMENT AND PLAN  Numbness and tingling - Plan: Rheumatoid factor, NCV with EMG(electromyography), Vitamin B12, Sjogren's syndrome antibods(ssa + ssb), Multiple Myeloma Panel (SPEP&IFE w/QIG), Cryoglobulin  Neuropathy - Plan: Rheumatoid factor, NCV with EMG(electromyography), Vitamin B12, Sjogren's syndrome antibods(ssa + ssb), Multiple Myeloma Panel (SPEP&IFE w/QIG), Cryoglobulin  Lumbar degenerative disc disease  Spinal stenosis of lumbar region, unspecified whether neurogenic claudication present - Plan: NCV with EMG(electromyography)   In summary, Mr. Montanye is a 75 year old man with numbness in the feet and the left hand.  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  We will check an NCV/EMG to further characterize as he also has significant degenerative changes in the lumbar spine that could be playing a role.  Additionally I will check blood work for treatable etiology including panel and B12, SSA/SSB, SPEP/IEF.  We will check the NCV/EMG will also evaluate the left arm to determine if there is a median or ulnar neuropathy or changes consistent with radiculopathy.   Currently, he is experiencing numbness with slight discomfort but no pain.  Therefore, we will hold off on gabapentin or other medication.  If symptoms worsen we can reconsider.  I will see him when he returns for the NCV/EMG study.  He should call sooner if there are new or worsening neurologic symptoms.  Thank you for asking me  to see Mr. Vensel.  Please let me know if I can be of further assistance with him or other patients in the future,   Karessa Onorato A. Felecia Shelling, MD, [facility] [id number], 21:22 AM Certified in Neurology, Clinical Neurophysiology, Sleep Medicine and Neuroimaging  [facility] [address] [place], [place] [address] [phone]

## 2021-10-13 LAB — SJOGREN'S SYNDROME ANTIBODS(SSA + SSB)
ENA SSA (RO) Ab: <0.2 AI (ref 0.0–0.9)
ENA SSB (LA) Ab: <0.2 AI (ref 0.0–0.9)

## 2021-10-13 LAB — CRYOGLOBULIN

## 2021-10-13 LAB — MULTIPLE MYELOMA PANEL, SERUM
Albumin SerPl Elph-Mcnc: 3.9 g/dL (ref 2.9–4.4)
Albumin/Glob SerPl: 1.2 (ref 0.7–1.7)
Alpha 1: 0.2 g/dL (ref 0.0–0.4)
Alpha2 Glob SerPl Elph-Mcnc: 0.8 g/dL (ref 0.4–1.0)
B-Globulin SerPl Elph-Mcnc: 1 g/dL (ref 0.7–1.3)
Gamma Glob SerPl Elph-Mcnc: 1.4 g/dL (ref 0.4–1.8)
Globulin, Total: 3.4 g/dL (ref 2.2–3.9)
IgA/Immunoglobulin A, Serum: 328 mg/dL (ref 61–437)
IgG (Immunoglobin G), Serum: 1365 mg/dL (ref 603–1613)
IgM (Immunoglobulin M), Srm: 77 mg/dL (ref 15–143)
Total Protein: 7.3 g/dL (ref 6.0–8.5)

## 2021-10-13 LAB — RHEUMATOID FACTOR: Rheumatoid fact SerPl-aCnc: 11.9 IU/mL (ref <14.0)

## 2021-10-13 LAB — VITAMIN B12: Vitamin B-12: 485 pg/mL (ref 232–1245)

## 2021-10-15 DIAGNOSIS — G4733 Obstructive sleep apnea (adult) (pediatric): Secondary | ICD-10-CM | POA: Diagnosis not present

## 2021-11-09 DIAGNOSIS — L814 Other melanin hyperpigmentation: Secondary | ICD-10-CM | POA: Diagnosis not present

## 2021-11-09 DIAGNOSIS — L570 Actinic keratosis: Secondary | ICD-10-CM | POA: Diagnosis not present

## 2021-11-09 DIAGNOSIS — L720 Epidermal cyst: Secondary | ICD-10-CM | POA: Diagnosis not present

## 2021-11-12 DIAGNOSIS — D124 Benign neoplasm of descending colon: Secondary | ICD-10-CM | POA: Diagnosis not present

## 2021-11-12 DIAGNOSIS — Z1211 Encounter for screening for malignant neoplasm of colon: Secondary | ICD-10-CM | POA: Diagnosis not present

## 2021-11-12 DIAGNOSIS — Z980 Intestinal bypass and anastomosis status: Secondary | ICD-10-CM | POA: Diagnosis not present

## 2021-11-12 DIAGNOSIS — K5730 Diverticulosis of large intestine without perforation or abscess without bleeding: Secondary | ICD-10-CM | POA: Diagnosis not present

## 2021-11-12 DIAGNOSIS — D122 Benign neoplasm of ascending colon: Secondary | ICD-10-CM | POA: Diagnosis not present

## 2021-11-12 DIAGNOSIS — K644 Residual hemorrhoidal skin tags: Secondary | ICD-10-CM | POA: Diagnosis not present

## 2021-11-12 DIAGNOSIS — K648 Other hemorrhoids: Secondary | ICD-10-CM | POA: Diagnosis not present

## 2021-11-17 DIAGNOSIS — D122 Benign neoplasm of ascending colon: Secondary | ICD-10-CM | POA: Diagnosis not present

## 2021-11-17 DIAGNOSIS — D124 Benign neoplasm of descending colon: Secondary | ICD-10-CM | POA: Diagnosis not present

## 2021-11-30 DIAGNOSIS — H5213 Myopia, bilateral: Secondary | ICD-10-CM | POA: Diagnosis not present

## 2021-12-09 ENCOUNTER — Ambulatory Visit (INDEPENDENT_AMBULATORY_CARE_PROVIDER_SITE_OTHER): Payer: Medicare HMO | Admitting: Neurology

## 2021-12-09 ENCOUNTER — Encounter: Payer: Medicare HMO | Admitting: Neurology

## 2021-12-09 DIAGNOSIS — G629 Polyneuropathy, unspecified: Secondary | ICD-10-CM

## 2021-12-09 DIAGNOSIS — R202 Paresthesia of skin: Secondary | ICD-10-CM

## 2021-12-09 DIAGNOSIS — M48061 Spinal stenosis, lumbar region without neurogenic claudication: Secondary | ICD-10-CM | POA: Diagnosis not present

## 2021-12-09 DIAGNOSIS — G5622 Lesion of ulnar nerve, left upper limb: Secondary | ICD-10-CM

## 2021-12-09 DIAGNOSIS — G5602 Carpal tunnel syndrome, left upper limb: Secondary | ICD-10-CM

## 2021-12-09 DIAGNOSIS — R200 Anesthesia of skin: Secondary | ICD-10-CM | POA: Diagnosis not present

## 2021-12-09 DIAGNOSIS — M5416 Radiculopathy, lumbar region: Secondary | ICD-10-CM | POA: Diagnosis not present

## 2021-12-09 DIAGNOSIS — Z0289 Encounter for other administrative examinations: Secondary | ICD-10-CM

## 2021-12-09 DIAGNOSIS — M5412 Radiculopathy, cervical region: Secondary | ICD-10-CM | POA: Diagnosis not present

## 2021-12-09 MED ORDER — PREGABALIN 75 MG PO CAPS: ORAL_CAPSULE | ORAL | 5 refills | Status: AC

## 2021-12-09 NOTE — Progress Notes (Signed)
Full Name: Tanner Hall Gender: Male MRN #: [id number] Date of Birth: 11/19/1946    Visit Date: 12/09/2021 14:34 Age: 75 Years Examining Physician: Arlice Colt, MD  Referring Physician: Arlice Colt, MD    History: Tanner Hall is a 75 year old man with progressive numbness in his feet and to a lesser extent in the hands, left greater than right.  He has pain radiating from the right buttock down the right leg and to a lesser extent down the left leg.  He is noting more pain when he walks, especially uphill.  Playing golf has become more painful.  There is no tenderness over the piriformis muscles or the lumbar paraspinal muscles.  Strength was normal in the hands.  Strength was 4+/5 in the EHL muscles.  Nerve conduction studies: The left peroneal, tibial and median motor responses had normal distal latencies, amplitudes and conduction velocities.  Left ulnar motor response had a mildly reduced amplitude with normal distal latency and mildly reduced conduction velocity.  The tibial F-wave latency was normal and the ulnar F-wave latency was minimally prolonged.  The left sural and superficial sensory responses had normal peak latencies and amplitudes.  The left median and ulnar sensory responses had mildly delayed peak latencies and slightly reduced amplitudes.  The galvanic sympathetic skin response was normal in the left foot.  Electromyography: Needle EMG of selected muscles of the left arm and right leg was performed.  In the arm, there was mild chronic denervation in the triceps and pronator teres muscle.  There were a few polyphasic motor units but normal recruitment in the flexor carpi ulnaris and first dorsal interosseous muscle.  The abductor pollicis brevis, biceps and deltoid were normal.  In the right leg, there was mild chronic denervation in the tibialis anterior, peroneus longus, gastrocnemius, abductor hallucis and gluteus medius muscles.  The iliopsoas and vastus medialis  were normal.  There is no abnormal spontaneous activity in any of the muscles tested.  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   Verbal informed consent was obtained from the patient, patient was informed of potential risk of procedure, including bruising, bleeding, hematoma formation, infection, muscle weakness, muscle pain, numbness, among others.        [place]    Nerve / Sites Muscle Latency Ref. Amplitude Ref. Rel Amp Segments Distance Velocity Ref. Area    ms ms mV mV %  cm m/s m/s mVms  L Median - APB     Wrist APB 4.4 ?4.4 8.1 ?4.0 100 Wrist - APB 7   24.6     Upper arm APB 8.6  7.6  93.7 Upper arm - Wrist 21 49 ?49 24.5  L Ulnar - ADM     Wrist ADM 2.4 ?3.3 4.6 ?6.0 100 Wrist - ADM 7   8.1     B.Elbow ADM 6.8  0.4  8.45 B.Elbow - Wrist 19 43 ?49 1.1     A.Elbow ADM 9.7  6.9  1776 A.Elbow - B.Elbow 10 35 ?49 17.5  L Peroneal - EDB     Ankle EDB 5.0 ?6.5 3.4 ?2.0 100 Ankle - EDB 9   10.9     Fib head EDB 12.1  4.6  135 Fib head - Ankle 31 44 ?44 14.4  Pop fossa EDB 14.4  4.1  88.9 Pop fossa - Fib head 10 44 ?44 12.8         Pop fossa - Ankle      L Tibial - AH     Ankle AH 3.9 ?5.8 6.3 ?4.0 100 Ankle - AH 9   11.2     Pop fossa AH 14.0  1.6  25.7 Pop fossa - Ankle 42 42 ?41 4.3             SSR    Nerve / Sites Latency   s  L Sympathetic - Foot     Foot 1.97        SNC    Nerve / Sites Rec. Site Peak Lat Ref.  Amp Ref. Segments Distance    ms ms V V  cm  L Sural - Ankle (Calf)     Calf Ankle 3.1 ?4.4 6 ?6 Calf - Ankle 14  L Superficial peroneal - Ankle     Lat leg Ankle 3.4 ?4.4 6 ?6 Lat leg - Ankle 14  L Median - Orthodromic (Dig II, Mid palm)     Dig II Wrist 4.1 ?3.4 7 ?10 Dig II - Wrist 13  L Ulnar - Orthodromic, (Dig V, Mid palm)     Dig V  Wrist 3.8 ?3.1 3 ?5 Dig V - Wrist 60             F  Wave    Nerve F Lat Ref.   ms ms  L Tibial - AH 55.9 ?56.0  L Ulnar - ADM 33.0 ?32.0         EMG Summary Table    Spontaneous MUAP Recruitment  Muscle IA Fib PSW Fasc Other Amp Dur. Poly Pattern  L. Deltoid Normal None None None _______ Normal Normal Normal Normal  L. Triceps brachii Normal None None None _______ Increased Increased 1+ Reduced  L. Biceps brachii Normal None None None _______ Normal Normal Normal Normal  L. Extensor digitorum communis Normal None None None _______ Normal Normal Normal Normal  L. Pronator teres Normal None None None _______ Normal Increased 1+ Reduced  L. Flexor carpi ulnaris Normal None None None _______ Normal Normal 1+ Normal  L. First dorsal interosseous Normal None None None _______ Normal Normal 1+ Normal  L. Abductor pollicis brevis Normal None None None _______ Normal Normal Normal Normal  R. Vastus medialis Normal None None None _______ Normal Normal 1+ Normal  R. Tibialis anterior Normal None None None _______ Normal Normal Normal Normal  R. Peroneus longus Normal None None None _______ Normal Normal 1+ Normal  R. Gastrocnemius (Medial head) Normal None None None _______ Increased Increased 1+ Reduced  R. Abductor hallucis Normal None None None _______ Normal Normal Normal Normal  R. Iliopsoas Normal None None None _______ Normal Normal Normal Normal  R. Gluteus medius Normal None None None _______ Normal Normal 1+ Reduced        [facility]  PATIENT: Tanner Hall DOB: 1946-11-13  REFERRING DOCTOR OR PCP: Lawerance Cruel, MD SOURCE: Patient, notes from Dr. Harrington Challenger, lab reports, imaging reports, MRI images personally reviewed.  _________________________________   HISTORICAL  CHIEF COMPLAINT:  Right greater than left leg pain, bilateral leg numbness and left greater than right hand numbness  HISTORY OF PRESENT ILLNESS:  Tanner Hall  is a 75 year old man with  slowly numbness in his feet and now the left hand starting around mid-2021.  He notes numbness as well as a pins/needles paresthesia.   He notes it more in the morning after waking up.  He has pain in the legs, right much more than left.  Pain radiates from the buttock into the foot.  He walks better on flat surface but has more back pain walking uphill or playing golf.       He had ESI at [facility] and had benefit short term only.  Lyrica has helped a little bit..    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  Imaging personally reviewed: MRI of the lumbar spine 09/23/2020 showed:  Mild spinal stenosis at L2-L3 with no nerve root compression  Mild to moderate spinal stenosis at L3-L4 due to degenerative changes and increased epidural fat.  There is no nerve root compression.   Moderate spinal stenosis at L4-L5 due to degenerative changes and increased epidural fat.  There is moderate left and mild right foraminal narrowing and moderately severe bilateral lateral recess stenosis with potential for L5 nerve root compression to either side. Increased epidural fat but no spinal stenosis at L5-S1.  However, facet hypertrophy and disc herniation causes moderately severe bilateral foraminal narrowing.  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  Additionally, there is lateral recess stenosis on the right.     REVIEW OF SYSTEMS: Constitutional: No fevers, chills, sweats, or change in appetite Eyes: No visual changes, double vision, eye pain Ear, nose and throat: No hearing loss, ear pain, nasal congestion, sore throat Cardiovascular: No chest pain, palpitations Respiratory:   No shortness of breath at rest or with exertion.   No wheezes GastrointestinaI: No nausea, vomiting, diarrhea, abdominal pain, fecal incontinence Genitourinary:  No dysuria, urinary retention or frequency.  No nocturia. Musculoskeletal: As above:  Integumentary: No rash, pruritus, skin lesions Neurological: as above Psychiatric: No depression at this time.  No anxiety Endocrine: No palpitations, diaphoresis, change in appetite, change in weigh or increased thirst Hematologic/Lymphatic:  No anemia, purpura, petechiae. Allergic/Immunologic: No itchy/runny eyes, nasal congestion, recent allergic reactions, rashes  ALLERGIES: Allergies  Allergen Reactions   Dust Mite Extract    Pollen Extract-Tree Extract [Pollen Extract]     HOME MEDICATIONS:  Current Outpatient Medications:    amLODipine (NORVASC) 2.5 MG tablet, , Disp: , Rfl:    aspirin-acetaminophen-caffeine (EXCEDRIN MIGRAINE) 250-250-65 MG per tablet, Take 2 tablets by mouth every 6 (six) hours as needed for headache., Disp: , Rfl:    celecoxib (CELEBREX) 200 MG capsule, Take 200 mg by mouth 2 (two) times daily., Disp: , Rfl:    fexofenadine-pseudoephedrine (ALLEGRA-D 24) 180-240 MG per 24 hr tablet, Take 1 tablet by mouth as needed. Alternates with other allergy meds, Disp: , Rfl:    finasteride (PROSCAR) 5 MG tablet, Take 5 mg by mouth daily., Disp: , Rfl: 12   finasteride (PROSCAR) 5 MG tablet, 1 tablet, Disp: , Rfl:    irbesartan (AVAPRO) 300 MG tablet, , Disp: , Rfl:    pravastatin (PRAVACHOL) 10 MG tablet, Take 10 mg by mouth once a week., Disp: , Rfl:    pregabalin (LYRICA) 75 MG capsule, One po qAM and two po qHS, Disp: 90 capsule,  Rfl: 5   sildenafil (VIAGRA) 100 MG tablet, Take 100 mg by mouth daily as needed for erectile dysfunction., Disp: , Rfl:    tiZANidine (ZANAFLEX) 2 MG tablet, Take 4 mg by mouth at bedtime., Disp: , Rfl:    traMADol (ULTRAM) 50 MG tablet, Take by mouth every 6 (six) hours as needed., Disp: , Rfl:    PAST MEDICAL HISTORY: Past Medical History:  Diagnosis Date   AAA (abdominal aortic aneurysm)    3.1 cm   BPH (benign prostatic hyperplasia)    Diverticulitis    GI bleed    Hematuria    PVD (peripheral vascular disease) (HCC)     PAST SURGICAL HISTORY: Past Surgical History:  Procedure Laterality Date   COLONOSCOPY  06/2011   CYSTOSCOPY FOR HEMATURIA     NO CANCER FOUND   DOUBLE INGUINAL REPAIR  1998   KNEE SURGERY     LT PAROTID MASS  05/2013   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   PARTIAL COLECTOMY FOR DIVERTICULITIS  10/2006    FAMILY HISTORY: Family History  Problem Relation Age of Onset   CVA Mother    Leukemia Father    Stomach cancer Paternal Grandfather     SOCIAL HISTORY:  Social History   Socioeconomic History   Marital status: Married    Spouse name: Roxanne   Number of children: 1   Years of education: Not on file   Highest education level: Bachelor's degree (e.g., BA, AB, BS)  Occupational History   Not on file  Tobacco Use   Smoking status: Former    Packs/day: 1.50    Years: 44.00    Pack years: 66.00    Types: Cigarettes    Start date: 1963    Quit date: 11/18/2006    Years since quitting: 15.0   Smokeless tobacco: Never  Substance and Sexual Activity   Alcohol use: Yes    Alcohol/week: 12.0 - 13.0 standard drinks    Types: 8 Glasses of wine, 3 Cans of beer, 1 - 2 Shots of liquor per week   Drug use: No   Sexual activity: Not on file  Other Topics Concern   Not on file  Social History Narrative   Educational degree   Live with wife and Son   Right handed   Caffeine: 1 cup of coffee a day   Social Determinants of Health   Financial Resource Strain: Not on file  Food Insecurity: Not on file  Transportation Needs: Not on file  Physical Activity: Not on file  Stress: Not on file  Social Connections: Not on file  Intimate Partner Violence: Not on file     PHYSICAL EXAM  There were no vitals filed for this  visit.   There is no height or weight on file to calculate BMI.   General: The patient is well-developed and well-nourished and in no acute distress  Neck: No carotid bruits are noted.  The neck is nontender.  Good range of motion  Musculoskeletal:  Back is nontender.  No tenderness over the piriformis muscles.  No tenderness over the trochanteric bursae.  Neurologic Exam  Mental status: The patient is alert and oriented x 3 at the time of the examination. The patient has apparent normal recent and remote memory, with an apparently normal attention span and concentration ability.   Speech is normal.  Cranial nerves: Normal facial strength  Motor:  Muscle bulk is normal.   Tone is normal. Strength is  5 /  5 in all 4 extremities except 4+/5 EHL muscles.   Sensory: Vibration sensation was 90% at the ankles and 20% at the toes compared to the knees.  Pinprick sensation was fairly normal in the feet.  Sensation was normal to touch and vibration in the arms..  Slight Tinel's sign at the left ulnar groove and left carpal tunnel  Gait and station: Station is normal.   Gait is mildly arthritic. Tandem gait is normal. Romberg is negative.   Reflexes: Deep tendon reflexes are symmetric and normal in the arms, 1 at the knees and trace at the ankles.        ASSESSMENT AND PLAN  Cervical radiculopathy  Neuropathy - Plan: NCV with EMG(electromyography)  Numbness and tingling - Plan: NCV with EMG(electromyography)  Spinal stenosis of lumbar region, unspecified whether neurogenic claudication present - Plan: NCV with EMG(electromyography)  Lumbar radiculopathy  Carpal tunnel syndrome, left  Ulnar neuropathy at elbow, left   We had a long discussion about the findings of the nerve conduction study.  His right leg pain is most likely due to the L5 and S1 chronic radiculopathies.  He does have significant degenerative change with spinal stenosis and multilevel foraminal and lateral recess  stenosis on MRI (detailed above) he does not have significant weakness.  Therefore, and the decision for him to proceed with surgery would be mostly due to his pain.  If he feels it continues to worsen he should further consider surgery.  Unfortunately because of the multilevel degenerative changes, he he would most likely require a several level fusion Increase Lyrica 75 mg in the mornings 150 mg at night The hand has some numbness but does not have pain or weakness.  He does have mild median and ulnar neuropathies as well as C7 radiculopathy.  If symptoms worsen a cervical spine MRI could be considered. Follow-up as needed.  Jaylanie Boschee A. Felecia Shelling, MD, [facility], [facility] 30/16/0109, 3:23 PM Certified in Neurology, Clinical Neurophysiology, Sleep Medicine and Neuroimaging  [facility] [address], [place], [place] [address] [phone]

## 2022-01-12 DIAGNOSIS — M5430 Sciatica, unspecified side: Secondary | ICD-10-CM | POA: Diagnosis not present

## 2022-01-12 DIAGNOSIS — E1169 Type 2 diabetes mellitus with other specified complication: Secondary | ICD-10-CM | POA: Diagnosis not present

## 2022-01-12 DIAGNOSIS — J3489 Other specified disorders of nose and nasal sinuses: Secondary | ICD-10-CM | POA: Diagnosis not present

## 2022-02-01 DIAGNOSIS — M25511 Pain in right shoulder: Secondary | ICD-10-CM | POA: Diagnosis not present

## 2022-02-03 DIAGNOSIS — M48061 Spinal stenosis, lumbar region without neurogenic claudication: Secondary | ICD-10-CM | POA: Diagnosis not present

## 2022-05-10 DIAGNOSIS — M545 Low back pain, unspecified: Secondary | ICD-10-CM | POA: Diagnosis not present

## 2022-05-12 DIAGNOSIS — M47816 Spondylosis without myelopathy or radiculopathy, lumbar region: Secondary | ICD-10-CM | POA: Diagnosis not present

## 2022-05-12 DIAGNOSIS — S76011D Strain of muscle, fascia and tendon of right hip, subsequent encounter: Secondary | ICD-10-CM | POA: Diagnosis not present

## 2022-05-18 DIAGNOSIS — S76011D Strain of muscle, fascia and tendon of right hip, subsequent encounter: Secondary | ICD-10-CM | POA: Diagnosis not present

## 2022-05-18 DIAGNOSIS — M47816 Spondylosis without myelopathy or radiculopathy, lumbar region: Secondary | ICD-10-CM | POA: Diagnosis not present

## 2022-05-20 DIAGNOSIS — M47816 Spondylosis without myelopathy or radiculopathy, lumbar region: Secondary | ICD-10-CM | POA: Diagnosis not present

## 2022-05-20 DIAGNOSIS — S76011D Strain of muscle, fascia and tendon of right hip, subsequent encounter: Secondary | ICD-10-CM | POA: Diagnosis not present

## 2022-05-25 DIAGNOSIS — M47816 Spondylosis without myelopathy or radiculopathy, lumbar region: Secondary | ICD-10-CM | POA: Diagnosis not present

## 2022-05-25 DIAGNOSIS — S76011D Strain of muscle, fascia and tendon of right hip, subsequent encounter: Secondary | ICD-10-CM | POA: Diagnosis not present

## 2022-05-27 DIAGNOSIS — M47816 Spondylosis without myelopathy or radiculopathy, lumbar region: Secondary | ICD-10-CM | POA: Diagnosis not present

## 2022-05-27 DIAGNOSIS — S76011D Strain of muscle, fascia and tendon of right hip, subsequent encounter: Secondary | ICD-10-CM | POA: Diagnosis not present

## 2022-06-01 DIAGNOSIS — S76011D Strain of muscle, fascia and tendon of right hip, subsequent encounter: Secondary | ICD-10-CM | POA: Diagnosis not present

## 2022-06-01 DIAGNOSIS — M47816 Spondylosis without myelopathy or radiculopathy, lumbar region: Secondary | ICD-10-CM | POA: Diagnosis not present

## 2022-06-02 DIAGNOSIS — M47816 Spondylosis without myelopathy or radiculopathy, lumbar region: Secondary | ICD-10-CM | POA: Diagnosis not present

## 2022-06-02 DIAGNOSIS — S76011D Strain of muscle, fascia and tendon of right hip, subsequent encounter: Secondary | ICD-10-CM | POA: Diagnosis not present

## 2022-06-07 DIAGNOSIS — L720 Epidermal cyst: Secondary | ICD-10-CM | POA: Diagnosis not present

## 2022-06-08 DIAGNOSIS — S76011D Strain of muscle, fascia and tendon of right hip, subsequent encounter: Secondary | ICD-10-CM | POA: Diagnosis not present

## 2022-06-08 DIAGNOSIS — M47816 Spondylosis without myelopathy or radiculopathy, lumbar region: Secondary | ICD-10-CM | POA: Diagnosis not present

## 2022-06-16 DIAGNOSIS — L918 Other hypertrophic disorders of the skin: Secondary | ICD-10-CM | POA: Diagnosis not present

## 2022-06-16 DIAGNOSIS — B0089 Other herpesviral infection: Secondary | ICD-10-CM | POA: Diagnosis not present

## 2022-06-28 DIAGNOSIS — S76011D Strain of muscle, fascia and tendon of right hip, subsequent encounter: Secondary | ICD-10-CM | POA: Diagnosis not present

## 2022-06-28 DIAGNOSIS — M47816 Spondylosis without myelopathy or radiculopathy, lumbar region: Secondary | ICD-10-CM | POA: Diagnosis not present

## 2022-07-05 DIAGNOSIS — M47816 Spondylosis without myelopathy or radiculopathy, lumbar region: Secondary | ICD-10-CM | POA: Diagnosis not present

## 2022-07-05 DIAGNOSIS — S76011D Strain of muscle, fascia and tendon of right hip, subsequent encounter: Secondary | ICD-10-CM | POA: Diagnosis not present

## 2022-07-14 DIAGNOSIS — M47816 Spondylosis without myelopathy or radiculopathy, lumbar region: Secondary | ICD-10-CM | POA: Diagnosis not present

## 2022-07-14 DIAGNOSIS — S76011D Strain of muscle, fascia and tendon of right hip, subsequent encounter: Secondary | ICD-10-CM | POA: Diagnosis not present

## 2022-07-16 DIAGNOSIS — M25551 Pain in right hip: Secondary | ICD-10-CM | POA: Diagnosis not present

## 2022-07-19 DIAGNOSIS — M47816 Spondylosis without myelopathy or radiculopathy, lumbar region: Secondary | ICD-10-CM | POA: Diagnosis not present

## 2022-07-19 DIAGNOSIS — S76011D Strain of muscle, fascia and tendon of right hip, subsequent encounter: Secondary | ICD-10-CM | POA: Diagnosis not present

## 2022-07-19 DIAGNOSIS — R972 Elevated prostate specific antigen [PSA]: Secondary | ICD-10-CM | POA: Diagnosis not present

## 2022-07-19 DIAGNOSIS — E1169 Type 2 diabetes mellitus with other specified complication: Secondary | ICD-10-CM | POA: Diagnosis not present

## 2022-07-19 DIAGNOSIS — E7800 Pure hypercholesterolemia, unspecified: Secondary | ICD-10-CM | POA: Diagnosis not present

## 2022-07-19 DIAGNOSIS — I10 Essential (primary) hypertension: Secondary | ICD-10-CM | POA: Diagnosis not present

## 2022-08-04 DIAGNOSIS — S76011D Strain of muscle, fascia and tendon of right hip, subsequent encounter: Secondary | ICD-10-CM | POA: Diagnosis not present

## 2022-08-04 DIAGNOSIS — M47816 Spondylosis without myelopathy or radiculopathy, lumbar region: Secondary | ICD-10-CM | POA: Diagnosis not present

## 2022-08-19 DIAGNOSIS — M5430 Sciatica, unspecified side: Secondary | ICD-10-CM | POA: Diagnosis not present

## 2022-08-19 DIAGNOSIS — E1169 Type 2 diabetes mellitus with other specified complication: Secondary | ICD-10-CM | POA: Diagnosis not present

## 2022-08-19 DIAGNOSIS — Z0000 Encounter for general adult medical examination without abnormal findings: Secondary | ICD-10-CM | POA: Diagnosis not present

## 2022-08-19 DIAGNOSIS — M47816 Spondylosis without myelopathy or radiculopathy, lumbar region: Secondary | ICD-10-CM | POA: Diagnosis not present

## 2022-08-19 DIAGNOSIS — G629 Polyneuropathy, unspecified: Secondary | ICD-10-CM | POA: Diagnosis not present

## 2022-08-19 DIAGNOSIS — I10 Essential (primary) hypertension: Secondary | ICD-10-CM | POA: Diagnosis not present

## 2022-08-19 DIAGNOSIS — R252 Cramp and spasm: Secondary | ICD-10-CM | POA: Diagnosis not present

## 2022-08-19 DIAGNOSIS — R972 Elevated prostate specific antigen [PSA]: Secondary | ICD-10-CM | POA: Diagnosis not present

## 2022-08-19 DIAGNOSIS — E7800 Pure hypercholesterolemia, unspecified: Secondary | ICD-10-CM | POA: Diagnosis not present

## 2022-08-24 DIAGNOSIS — M545 Low back pain, unspecified: Secondary | ICD-10-CM | POA: Diagnosis not present

## 2022-09-08 DIAGNOSIS — M48061 Spinal stenosis, lumbar region without neurogenic claudication: Secondary | ICD-10-CM | POA: Diagnosis not present

## 2022-10-18 DIAGNOSIS — E669 Obesity, unspecified: Secondary | ICD-10-CM | POA: Diagnosis not present

## 2022-10-18 DIAGNOSIS — G4733 Obstructive sleep apnea (adult) (pediatric): Secondary | ICD-10-CM | POA: Diagnosis not present

## 2022-10-24 DIAGNOSIS — L03113 Cellulitis of right upper limb: Secondary | ICD-10-CM | POA: Diagnosis not present

## 2022-10-24 DIAGNOSIS — J3089 Other allergic rhinitis: Secondary | ICD-10-CM | POA: Diagnosis not present

## 2022-10-29 DIAGNOSIS — M5430 Sciatica, unspecified side: Secondary | ICD-10-CM | POA: Diagnosis not present

## 2022-10-29 DIAGNOSIS — Z09 Encounter for follow-up examination after completed treatment for conditions other than malignant neoplasm: Secondary | ICD-10-CM | POA: Diagnosis not present

## 2022-10-29 DIAGNOSIS — Z6838 Body mass index (BMI) 38.0-38.9, adult: Secondary | ICD-10-CM | POA: Diagnosis not present

## 2022-10-29 DIAGNOSIS — L03113 Cellulitis of right upper limb: Secondary | ICD-10-CM | POA: Diagnosis not present

## 2022-11-08 DIAGNOSIS — M5459 Other low back pain: Secondary | ICD-10-CM | POA: Diagnosis not present

## 2022-11-15 DIAGNOSIS — L814 Other melanin hyperpigmentation: Secondary | ICD-10-CM | POA: Diagnosis not present

## 2022-11-15 DIAGNOSIS — L821 Other seborrheic keratosis: Secondary | ICD-10-CM | POA: Diagnosis not present

## 2022-11-15 DIAGNOSIS — D225 Melanocytic nevi of trunk: Secondary | ICD-10-CM | POA: Diagnosis not present

## 2022-11-15 DIAGNOSIS — L570 Actinic keratosis: Secondary | ICD-10-CM | POA: Diagnosis not present

## 2022-11-15 DIAGNOSIS — M5459 Other low back pain: Secondary | ICD-10-CM | POA: Diagnosis not present

## 2022-11-22 DIAGNOSIS — M5459 Other low back pain: Secondary | ICD-10-CM | POA: Diagnosis not present

## 2022-11-23 DIAGNOSIS — R972 Elevated prostate specific antigen [PSA]: Secondary | ICD-10-CM | POA: Diagnosis not present

## 2022-11-30 DIAGNOSIS — H5203 Hypermetropia, bilateral: Secondary | ICD-10-CM | POA: Diagnosis not present

## 2022-11-30 DIAGNOSIS — R972 Elevated prostate specific antigen [PSA]: Secondary | ICD-10-CM | POA: Diagnosis not present

## 2022-12-01 DIAGNOSIS — H52209 Unspecified astigmatism, unspecified eye: Secondary | ICD-10-CM | POA: Diagnosis not present

## 2022-12-01 DIAGNOSIS — H5203 Hypermetropia, bilateral: Secondary | ICD-10-CM | POA: Diagnosis not present

## 2022-12-08 DIAGNOSIS — M5416 Radiculopathy, lumbar region: Secondary | ICD-10-CM | POA: Diagnosis not present

## 2022-12-08 DIAGNOSIS — M48061 Spinal stenosis, lumbar region without neurogenic claudication: Secondary | ICD-10-CM | POA: Diagnosis not present

## 2023-01-12 DIAGNOSIS — R972 Elevated prostate specific antigen [PSA]: Secondary | ICD-10-CM | POA: Diagnosis not present

## 2023-01-18 DIAGNOSIS — R972 Elevated prostate specific antigen [PSA]: Secondary | ICD-10-CM | POA: Diagnosis not present

## 2023-02-16 DIAGNOSIS — R972 Elevated prostate specific antigen [PSA]: Secondary | ICD-10-CM | POA: Diagnosis not present

## 2023-02-16 DIAGNOSIS — I10 Essential (primary) hypertension: Secondary | ICD-10-CM | POA: Diagnosis not present

## 2023-02-16 DIAGNOSIS — Z79899 Other long term (current) drug therapy: Secondary | ICD-10-CM | POA: Diagnosis not present

## 2023-02-16 DIAGNOSIS — F17290 Nicotine dependence, other tobacco product, uncomplicated: Secondary | ICD-10-CM | POA: Diagnosis not present

## 2023-02-22 DIAGNOSIS — E1142 Type 2 diabetes mellitus with diabetic polyneuropathy: Secondary | ICD-10-CM | POA: Diagnosis not present

## 2023-02-22 DIAGNOSIS — Z6839 Body mass index (BMI) 39.0-39.9, adult: Secondary | ICD-10-CM | POA: Diagnosis not present

## 2023-02-22 DIAGNOSIS — M549 Dorsalgia, unspecified: Secondary | ICD-10-CM | POA: Diagnosis not present

## 2023-02-22 DIAGNOSIS — I10 Essential (primary) hypertension: Secondary | ICD-10-CM | POA: Diagnosis not present

## 2023-02-22 DIAGNOSIS — J309 Allergic rhinitis, unspecified: Secondary | ICD-10-CM | POA: Diagnosis not present

## 2023-02-24 DIAGNOSIS — E1169 Type 2 diabetes mellitus with other specified complication: Secondary | ICD-10-CM | POA: Diagnosis not present

## 2023-03-15 DIAGNOSIS — R972 Elevated prostate specific antigen [PSA]: Secondary | ICD-10-CM | POA: Diagnosis not present

## 2023-06-17 ENCOUNTER — Other Ambulatory Visit: Payer: Self-pay

## 2023-06-17 ENCOUNTER — Ambulatory Visit: Payer: Medicare HMO | Admitting: Allergy

## 2023-06-17 ENCOUNTER — Encounter: Payer: Self-pay | Admitting: Allergy

## 2023-06-17 VITALS — BP 140/70 | HR 66 | Temp 98.8°F | Resp 12 | Ht 68.75 in | Wt 258.6 lb

## 2023-06-17 DIAGNOSIS — R0982 Postnasal drip: Secondary | ICD-10-CM

## 2023-06-17 DIAGNOSIS — J3089 Other allergic rhinitis: Secondary | ICD-10-CM

## 2023-06-17 DIAGNOSIS — J302 Other seasonal allergic rhinitis: Secondary | ICD-10-CM | POA: Diagnosis not present

## 2023-06-17 NOTE — Patient Instructions (Addendum)
-   Testing today showed: grasses, ragweed, weeds, trees, indoor molds, outdoor molds, dust mites, cat, and cockroach - Copy of test results provided.  - Avoidance measures provided. - Start taking: Xyzal (levocetirizine) 5mg  tablet once daily. Atrovent (ipratropium) 0.06% 1-2 sprays per nostril 3-4 times daily as needed (CAN BE OVER DRYING) for nasal drainage - You can use an extra dose of the antihistamine, if needed, for breakthrough symptoms.  - Recommend nasal saline rinses 1-2 times daily to remove allergens from the nasal cavities as well as help with mucous clearance (this is especially helpful to do before the nasal sprays are given) - Consider allergy shots as a means of long-term control if medication management is not effective enough. - Allergy shots "re-train" and "reset" the immune system to ignore environmental allergens and decrease the resulting immune response to those allergens (sneezing, itchy watery eyes, runny nose, nasal congestion, etc).    - Allergy shots improve symptoms in 75-85% of patients.  - We can discuss more at a future appointment if the medications are not working for you.   Follow-up in 3-4 months or sooner if needed

## 2023-06-17 NOTE — Progress Notes (Signed)
New Patient Note  RE: RAYHAN GROLEAU MRN: [id number] DOB: 07-21-1946 Date of Office Visit: 06/17/2023  Primary care provider: Daisy Floro, MD  Chief Complaint: Allergies  History of present illness: Tanner Hall is a 77 y.o. male presenting today for evaluation of allergies.  He has OSA and does use a CPAP nightly.  He states he was hacking up mucus in the mornings.  He discussed this with his OSA provider.  It was discussed that maybe his symptoms were related to the humidity from using water in the CPAP machine.  The decision was made to stop using water in the machine.  This did seem to help.  However he states in the morning he does still note a thick mucus in the throat that he has to throat clear.  It does seem to clear up during the day.  Around evening time he can start feeling the post-nasal drip starting up again.  He states he has been provided with a nasal spray like Flonase that he uses but doesn't help much.  He has not noted any foods that seem to make it worse.  As he gets older it seems to be more of an irritation.  He quit smoking years ago.  However he states for his birthday a while back he started smoking cigars and this seems to worsen the symptoms thus he stopped smoking cigars too. He has saline spray that he uses as well. He has taken antiobiotic for this and does feel like it helped.  He also reports during the spring he has a lot of congestion as well.  He spends a lot of time outside.   He states he was prescribed ipratropium spray to use during Covid. He picked up new refills of this yesteraday.   He states he did see an allergist when he was a child.  He denies doing immunotherapy at that time.  No history of diagnosis of asthma, food allergy, eczema.   Review of systems: Review of Systems  Constitutional: Negative.   HENT:         See HPI  Eyes: Negative.   Respiratory: Negative.    Cardiovascular: Negative.   Musculoskeletal: Negative.   Skin:  Negative.   Allergic/Immunologic: Negative.   Neurological: Negative.     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  Cat in the home.  There are dogs and cats outside the home.  There is no concern for  water damage, mildew or roaches in the home.  He is retired.  [place]  He is exposed to fumes, chemicals or dust when doing yard work. Tobacco Use   Smoking status: Former    Packs/day: 1.50    Years: 44.00    Additional pack years: 0.00    Total pack years: 66.00    Types: Cigarettes    Start date: 1963    Quit date: 11/18/2006    Years since quitting: 16.5   Smokeless tobacco: Never     Medication List: Current Outpatient Medications  Medication Sig Dispense Refill   amLODipine (NORVASC) 2.5 MG tablet      aspirin-acetaminophen-caffeine (EXCEDRIN MIGRAINE) 250-250-65 MG per tablet Take 2 tablets by mouth every 6 (six) hours as needed for headache.     celecoxib (CELEBREX) 200 MG capsule Take 200 mg by mouth 2 (two) times daily.     fexofenadine-pseudoephedrine (ALLEGRA-D 24) 180-240 MG per 24 hr tablet Take 1  tablet by mouth as needed. Alternates with other allergy meds     finasteride (PROSCAR) 5 MG tablet Take 5 mg by mouth daily.  12   finasteride (PROSCAR) 5 MG tablet 1 tablet     irbesartan (AVAPRO) 300 MG tablet      pravastatin (PRAVACHOL) 10 MG tablet Take 10 mg by mouth once a week.     pregabalin (LYRICA) 75 MG capsule One po qAM and two po qHS 90 capsule 5   sildenafil (VIAGRA) 100 MG tablet Take 100 mg by mouth daily as needed for erectile dysfunction.     tiZANidine (ZANAFLEX) 2 MG tablet Take 4 mg by mouth at bedtime.     traMADol (ULTRAM) 50 MG tablet Take by mouth every 6 (six) hours as needed.     No current facility-administered medications for this visit.    Known medication allergies: Allergies  Allergen Reactions   Dust Mite Extract    Pollen Extract-Tree Extract [Pollen Extract]      Physical examination: Blood pressure (!) 140/70, pulse 66, temperature 98.8 F (37.1 C), resp. rate 12, height 5' 8.75" (1.746 m), weight 258 lb 9.6 oz (117.3 kg), SpO2 95 %.  General: Alert, interactive, in no acute distress. HEENT: PERRLA, TMs pearly gray, turbinates mildly edematous without discharge, post-pharynx non erythematous. Neck: Supple without lymphadenopathy. Lungs: Clear to auscultation without wheezing, rhonchi or rales. {no increased work of breathing. CV: Normal S1, S2 without murmurs. Abdomen: Nondistended, nontender. Skin: Warm and dry, without lesions or rashes. Extremities:  No clubbing, cyanosis or edema. Neuro:   Grossly intact.  Diagnositics/Labs:  Allergy testing:   Airborne Adult Perc - 06/17/23 1500     Time Antigen Placed 1520    Allergen Manufacturer Waynette Buttery    Location Back    Number of Test 55    1. Control-Buffer 50% Glycerol Negative    2. Control-Histamine 2+    3. Bahia 4+    4. [place] 4+    5. Johnson 3+    6. [place] Blue 3+    7. Meadow Fescue 3+    8. Perennial Rye 3+    9. Timothy Negative    10. Ragweed Mix 3+    11. Cocklebur  Negative    12. Plantain,  English 2+    13. Baccharis Negative    14. Dog Fennel 2+    15. Guernsey Thistle 2+    16. Lamb's Quarters 2+    17. Sheep Sorrell Negative  18. Rough Pigweed 2+    19. Marsh Elder, Rough 2+    20. Mugwort, Common Negative    21. Box, Elder Negative    22. Cedar, red Negative    23. Sweet Gum 2+    24. Pecan Pollen 2+    25. Pine Mix Negative    26. Walnut, Black Pollen 2+    27. Red Mulberry Negative    28. Ash Mix Negative    29. Birch Mix Negative    30. Beech American Negative    31. Cottonwood, [place] 2+    32. Hickory, White 3+    33. Maple Mix Negative    34. Oak, [place] Mix Negative    35. Sycamore Eastern Negative    36. Alternaria Alternata 2+    37. Cladosporium Herbarum Negative    38. Aspergillus Mix 2+    39. Penicillium Mix Negative    40. Bipolaris Sorokiniana (Helminthosporium) Negative    41. Drechslera Spicifera (Curvularia) 2+    42. Mucor Plumbeus Negative    43. Fusarium Moniliforme 3+    44. Aureobasidium Pullulans (pullulara) Negative    45. Rhizopus Oryzae Negative    46. Botrytis Cinera Negative    47. Epicoccum Nigrum Negative    48. Phoma Betae 2+    49. Dust Mite Mix 2+    50. Cat Hair 10,000 BAU/ml 2+    51.  Dog Epithelia Negative    52. Mixed Feathers Negative    53. Horse Epithelia Negative    54. Cockroach, German 2+    55. Tobacco Leaf Negative             Allergy testing results were read and interpreted by provider, documented by clinical staff.   Assessment and plan: Allergic rhinitis Post-nasal drip   - Testing today showed: grasses, ragweed, weeds, trees, indoor molds, outdoor molds, dust mites, cat, and cockroach - Copy of test results provided.  - Avoidance measures provided. - Start taking: Xyzal (levocetirizine) 5mg  tablet once daily. Atrovent (ipratropium) 0.06% 1-2 sprays per nostril 3-4 times daily as needed (CAN BE OVER DRYING) for nasal drainage - You can use an extra dose of  the antihistamine, if needed, for breakthrough symptoms.  - Recommend nasal saline rinses 1-2 times daily to remove allergens from the nasal cavities as well as help with mucous clearance (this is especially helpful to do before the nasal sprays are given) - Consider allergy shots as a means of long-term control if medication management is not effective enough. - Allergy shots "re-train" and "reset" the immune system to ignore environmental allergens and decrease the resulting immune response to those allergens (sneezing, itchy watery eyes, runny nose, nasal congestion, etc).    - Allergy shots improve symptoms in 75-85% of patients.  - We can discuss more at a future appointment if the medications are not working for you.   Follow-up in 3-4 months or sooner if needed   I appreciate the opportunity to take part in Add's care. Please do not hesitate to contact me with questions.  Sincerely,   Margo Aye, MD Allergy/Immunology [facility]

## 2023-06-23 DIAGNOSIS — M48061 Spinal stenosis, lumbar region without neurogenic claudication: Secondary | ICD-10-CM | POA: Diagnosis not present

## 2023-06-23 DIAGNOSIS — M5416 Radiculopathy, lumbar region: Secondary | ICD-10-CM | POA: Diagnosis not present

## 2023-08-18 DIAGNOSIS — E7800 Pure hypercholesterolemia, unspecified: Secondary | ICD-10-CM | POA: Diagnosis not present

## 2023-08-18 DIAGNOSIS — I10 Essential (primary) hypertension: Secondary | ICD-10-CM | POA: Diagnosis not present

## 2023-08-18 DIAGNOSIS — Z125 Encounter for screening for malignant neoplasm of prostate: Secondary | ICD-10-CM | POA: Diagnosis not present

## 2023-08-18 DIAGNOSIS — E1169 Type 2 diabetes mellitus with other specified complication: Secondary | ICD-10-CM | POA: Diagnosis not present

## 2023-08-23 DIAGNOSIS — I10 Essential (primary) hypertension: Secondary | ICD-10-CM | POA: Diagnosis not present

## 2023-08-23 DIAGNOSIS — E119 Type 2 diabetes mellitus without complications: Secondary | ICD-10-CM | POA: Diagnosis not present

## 2023-08-23 DIAGNOSIS — J309 Allergic rhinitis, unspecified: Secondary | ICD-10-CM | POA: Diagnosis not present

## 2023-08-23 DIAGNOSIS — Z6837 Body mass index (BMI) 37.0-37.9, adult: Secondary | ICD-10-CM | POA: Diagnosis not present

## 2023-08-23 DIAGNOSIS — M549 Dorsalgia, unspecified: Secondary | ICD-10-CM | POA: Diagnosis not present

## 2023-08-23 DIAGNOSIS — Z0000 Encounter for general adult medical examination without abnormal findings: Secondary | ICD-10-CM | POA: Diagnosis not present

## 2023-08-23 DIAGNOSIS — E7800 Pure hypercholesterolemia, unspecified: Secondary | ICD-10-CM | POA: Diagnosis not present

## 2023-08-23 DIAGNOSIS — N400 Enlarged prostate without lower urinary tract symptoms: Secondary | ICD-10-CM | POA: Diagnosis not present

## 2023-09-22 ENCOUNTER — Ambulatory Visit: Payer: Medicare HMO | Admitting: Allergy

## 2023-09-22 ENCOUNTER — Encounter: Payer: Self-pay | Admitting: Allergy

## 2023-09-22 ENCOUNTER — Other Ambulatory Visit: Payer: Self-pay

## 2023-09-22 VITALS — BP 122/66 | HR 64 | Temp 98.5°F | Wt 256.5 lb

## 2023-09-22 DIAGNOSIS — R0982 Postnasal drip: Secondary | ICD-10-CM

## 2023-09-22 DIAGNOSIS — J302 Other seasonal allergic rhinitis: Secondary | ICD-10-CM

## 2023-09-22 DIAGNOSIS — J3089 Other allergic rhinitis: Secondary | ICD-10-CM | POA: Diagnosis not present

## 2023-09-22 MED ORDER — LEVOCETIRIZINE DIHYDROCHLORIDE 5 MG PO TABS: 5.0000 mg | ORAL_TABLET | Freq: Every evening | ORAL | 1 refills | Status: DC

## 2023-09-22 NOTE — Patient Instructions (Addendum)
-    Continue avoidance measures grasses, ragweed, weeds, trees, indoor molds, outdoor molds, dust mites, cat, and cockroach - Use Xyzal (levocetirizine) 5mg  tablet once daily as needed Atrovent (ipratropium) 0.06% 1-2 sprays per nostril 3-4 times daily as needed (CAN BE OVER DRYING) for nasal drainage - You can use an extra dose of the antihistamine, if needed, for breakthrough symptoms.  - Continue nasal saline rinses 1-2 times daily to remove allergens from the nasal cavities as well as help with mucous clearance (this is especially helpful to do before the nasal sprays are given) - Consider allergy shots as a means of long-term control if medication management is not effective enough. - Allergy shots "re-train" and "reset" the immune system to ignore environmental allergens and decrease the resulting immune response to those allergens (sneezing, itchy watery eyes, runny nose, nasal congestion, etc).    - Allergy shots improve symptoms in 75-85% of patients.  - We can discuss more at a future appointment if the medications are not working for you.   Follow-up in 6 months or sooner if needed   Astepro is an OTC nasal drainage spray

## 2023-09-29 DIAGNOSIS — M48061 Spinal stenosis, lumbar region without neurogenic claudication: Secondary | ICD-10-CM | POA: Diagnosis not present

## 2023-10-24 DIAGNOSIS — G4733 Obstructive sleep apnea (adult) (pediatric): Secondary | ICD-10-CM | POA: Diagnosis not present

## 2023-11-17 DIAGNOSIS — L570 Actinic keratosis: Secondary | ICD-10-CM | POA: Diagnosis not present

## 2023-11-17 DIAGNOSIS — L308 Other specified dermatitis: Secondary | ICD-10-CM | POA: Diagnosis not present

## 2023-11-17 DIAGNOSIS — L814 Other melanin hyperpigmentation: Secondary | ICD-10-CM | POA: Diagnosis not present

## 2023-11-22 DIAGNOSIS — M48061 Spinal stenosis, lumbar region without neurogenic claudication: Secondary | ICD-10-CM | POA: Diagnosis not present

## 2023-12-05 DIAGNOSIS — H52203 Unspecified astigmatism, bilateral: Secondary | ICD-10-CM | POA: Diagnosis not present

## 2023-12-05 DIAGNOSIS — H2513 Age-related nuclear cataract, bilateral: Secondary | ICD-10-CM | POA: Diagnosis not present

## 2023-12-05 DIAGNOSIS — H40013 Open angle with borderline findings, low risk, bilateral: Secondary | ICD-10-CM | POA: Diagnosis not present

## 2023-12-06 DIAGNOSIS — H5203 Hypermetropia, bilateral: Secondary | ICD-10-CM | POA: Diagnosis not present

## 2023-12-06 DIAGNOSIS — H52209 Unspecified astigmatism, unspecified eye: Secondary | ICD-10-CM | POA: Diagnosis not present

## 2024-01-04 ENCOUNTER — Encounter: Payer: Self-pay | Admitting: Allergy

## 2024-01-04 ENCOUNTER — Ambulatory Visit: Payer: Medicare HMO | Admitting: Allergy

## 2024-01-04 VITALS — BP 124/50 | HR 78 | Temp 97.3°F

## 2024-01-04 DIAGNOSIS — R0982 Postnasal drip: Secondary | ICD-10-CM

## 2024-01-04 DIAGNOSIS — J3089 Other allergic rhinitis: Secondary | ICD-10-CM

## 2024-01-04 DIAGNOSIS — J302 Other seasonal allergic rhinitis: Secondary | ICD-10-CM

## 2024-01-04 NOTE — Patient Instructions (Addendum)
-    Continue avoidance measures grasses, ragweed, weeds, trees, indoor molds, outdoor molds, dust mites, cat, and cockroach - Use Xyzal  (levocetirizine) 5mg  tablet once daily as needed Atrovent  (ipratropium) 0.06% 1-2 sprays per nostril 3-4 times daily as needed (CAN BE OVER DRYING) for nasal drainage - You can use an extra dose of the antihistamine, if needed, for breakthrough symptoms.  - Continue nasal saline rinses 1-2 times daily to remove allergens from the nasal cavities as well as help with mucous clearance (this is especially helpful to do before the nasal sprays are given) - Consider allergy  shots as a means of long-term control if medication management is not effective enough.  Discussed this option today.  Provided with insurance codes to contact your insurance carrier regarding coverage and if ok you can schedule a new start appointment. - Allergy  shots re-train and reset the immune system to ignore environmental allergens and decrease the resulting immune response to those allergens (sneezing, itchy watery eyes, runny nose, nasal congestion, etc).    - Allergy  shots improve symptoms in 75-85% of patients.   Follow-up in 6 months or sooner if needed    Can ask about Elidel, Protopic or Opzelura non-steroid ointments

## 2024-01-04 NOTE — Progress Notes (Signed)
 Follow-up Note  RE: Tanner Hall MRN: [id number] DOB: 1946/05/13 Date of Office Visit: 01/04/2024   History of present illness : Tanner Hall is a 78 y.o. male presenting today for follow-up of allergic rhinitis. He was last seen in the office on 09/22/23 by myself.    Discussed the use of AI scribe software for clinical note transcription with the patient, who gave verbal consent to  proceed.  The patient, with a history of allergies, presents with persistent postnasal drip that has improved somewhat with the use of Atrovent  nasal spray. The patient reports a sensation of mucus accumulation in the throat, which he attempts to alleviate through various methods such as gargling. Despite these efforts, the sensation of mucus 'stuck' in the throat persists. He does have a cat in the home and has a cat allergy .  He has been using the nasal saline rinse as well that does help provide additional control. He is currently using Xyzal  as antihistamine.  The patient has considered allergy  shots as a potential treatment option, expressing a desire to reduce reliance on daily medications. He has concerns about the cost and frequency of these shots.   Review of systems: 10pt ROS negative unless noted above in HPI   All other systems negative unless noted above in HPI  Past medical/social/surgical/family history have been reviewed and are unchanged unless specifically indicated below.  No changes  Medication List: Current Outpatient Medications  Medication Sig Dispense Refill   amLODipine (NORVASC) 2.5 MG tablet      aspirin-acetaminophen-caffeine (EXCEDRIN MIGRAINE) 250-250-65 MG per tablet Take 2 tablets by mouth every 6 (six) hours as needed for headache.     celecoxib (CELEBREX) 200 MG capsule Take 200 mg by mouth 2 (two) times daily.     fexofenadine-pseudoephedrine (ALLEGRA-D 24) 180-240 MG per 24 hr tablet Take 1 tablet by mouth as needed. Alternates with other allergy  meds     finasteride  (PROSCAR) 5 MG tablet Take 5 mg by mouth daily.  12   finasteride (PROSCAR) 5 MG tablet 1 tablet     ipratropium (ATROVENT ) 0.06 % nasal spray Place 2 sprays into both nostrils 3 (three) times daily.     irbesartan (AVAPRO) 300 MG tablet      levocetirizine (XYZAL ) 5 MG tablet Take 1 tablet (5 mg total) by mouth every evening. 90 tablet 1   magnesium oxide (MAG-OX) 400 MG tablet Take 400 mg by mouth as needed.     Multiple Vitamin (MULTI-VITAMIN) tablet Take 1 tablet by mouth daily.     pravastatin (PRAVACHOL) 10 MG tablet Take 10 mg by mouth once a week.     predniSONE  (STERAPRED UNI-PAK 21 TAB) 10 MG (21) TBPK tablet Take 10 mg by mouth as needed.     pregabalin  (LYRICA ) 75 MG capsule One po qAM and two po qHS 90 capsule 5   RYBELSUS 7 MG TABS Take 1 tablet by mouth daily.     sildenafil (VIAGRA) 100 MG tablet Take 100 mg by mouth daily as needed for erectile dysfunction.     tiZANidine (ZANAFLEX) 2 MG tablet Take 4 mg by mouth at bedtime.     traMADol (ULTRAM) 50 MG tablet Take by mouth every 6 (six) hours as needed.     No current facility-administered medications for this visit.     Known medication allergies: Allergies  Allergen Reactions   Dust Mite Extract    Pollen Extract-Tree Extract [Pollen Extract]     Physical  examination: Blood pressure (!) 124/50, pulse 78, temperature (!) 97.3 F (36.3 C), SpO2 96%.  General: Alert, interactive, in no acute distress. HEENT: PERRLA, TMs pearly gray, turbinates non-edematous with clear discharge, post-pharynx non erythematous. Neck: Supple without lymphadenopathy. Lungs: Clear to auscultation without wheezing, rhonchi or rales. {no increased work of breathing. CV: Normal S1, S2 without murmurs. Abdomen: Nondistended, nontender. Skin: Warm and dry, without lesions or rashes. Extremities:  No clubbing, cyanosis or edema. Neuro:   Grossly intact.  Diagnositics/Labs: None today  Assessment and plan: Allergic rhinitis Post nasal  drip -  Continue avoidance measures grasses, ragweed, weeds, trees, indoor molds, outdoor molds, dust mites, cat, and cockroach - Use Xyzal  (levocetirizine) 5mg  tablet once daily as needed Atrovent  (ipratropium) 0.06% 1-2 sprays per nostril 3-4 times daily as needed (CAN BE OVER DRYING) for nasal drainage - You can use an extra dose of the antihistamine, if needed, for breakthrough symptoms.  - Continue nasal saline rinses 1-2 times daily to remove allergens from the nasal cavities as well as help with mucous clearance (this is especially helpful to do before the nasal sprays are given) - Consider allergy  shots as a means of long-term control if medication management is not effective enough.  Discussed this option today.  Provided with insurance codes to contact your insurance carrier regarding coverage and if ok you can schedule a new start appointment. - Allergy  shots re-train and reset the immune system to ignore environmental allergens and decrease the resulting immune response to those allergens (sneezing, itchy watery eyes, runny nose, nasal congestion, etc).    - Allergy  shots improve symptoms in 75-85% of patients.   Follow-up in 6 months or sooner if needed  I appreciate the opportunity to take part in Jaiyon's care. Please do not hesitate to contact me with questions.  Sincerely,   Danita Brain, MD Allergy /Immunology [facility]

## 2024-01-10 NOTE — Addendum Note (Signed)
 Addended by: Lorrin Mais on: 01/10/2024 05:33 PM   Modules accepted: Orders

## 2024-01-11 DIAGNOSIS — J3081 Allergic rhinitis due to animal (cat) (dog) hair and dander: Secondary | ICD-10-CM | POA: Diagnosis not present

## 2024-01-11 NOTE — Progress Notes (Signed)
 Aeroallergen Immunotherapy  Ordering Provider: Dr. Catha Clink  Patient Details Name: Tanner Hall MRN: [id number] Date of Birth: 09-21-1946  Order 2 of 2  Vial Label: mold, mite, roach  0.2 ml (Volume)  1:20 Concentration -- Cladosporium herbarum 0.2 ml (Volume)  1:10 Concentration -- Aspergillus mix 0.2 ml (Volume)  1:20 Concentration -- Drechslera spicifera 0.2 ml (Volume)  1:10 Concentration -- Fusarium moniliforme 0.2 ml (Volume)  1:40 Concentration -- Phoma betae 0.3 ml (Volume)  1:20 Concentration -- Cockroach, German 0.5 ml (Volume)   AU Concentration -- Mite Mix (DF 5,000 & DP 5,000)   1.8  ml Extract Subtotal 3.2  ml Diluent 5.0  ml Maintenance Total  Schedule:  B  Blue Vial (1:100,000): Schedule B (6 doses) Yellow Vial (1:10,000): Schedule B (6 doses) Green Vial (1:1,000): Schedule B (6 doses) Red Vial (1:100): Schedule A (14 doses)  Special Instructions:  After completion of the first Red Vial, please space to every two weeks. After completion of the second Red Vial, please space to every 4 weeks. Ok to up dose new vials at 0.64mL --> 0.3 mL --> 0.5 mL.  Ok to come twice weekly, if desired, as long as there is 48 hours between injections.

## 2024-01-11 NOTE — Progress Notes (Signed)
 Aeroallergen Immunotherapy  Ordering Provider: Dr. Catha Clink  Patient Details Name: Tanner Hall MRN: [id number] Date of Birth: 1946-02-11  Order 1 of 2  Vial Label: pollen, cat  0.3 ml (Volume)  BAU Concentration -- 7 Grass Mix* 100,000 ([place]  Blue, [place], [place], Perennial Rye, RedTop, Sweet Vernal, Timothy) 0.2 ml (Volume)  1:20 Concentration -- Bahia 0.3 ml (Volume)  BAU Concentration -- [place] 10,000 0.2 ml (Volume)  1:20 Concentration -- Johnson 0.3 ml (Volume)  1:20 Concentration -- Ragweed Mix 0.2 ml (Volume)  1:10 Concentration -- Plantain English 0.2 ml (Volume)  1:20 Concentration -- Guernsey Thistle 0.2 ml (Volume)  1:80 Concentration -- Dogfennel 0.5 ml (Volume)  1:20 Concentration -- Weed Mix* 0.5 ml (Volume)  1:20 Concentration -- Eastern 10 Tree Mix (also Sweet Gum) 0.2 ml (Volume)  1:10 Concentration -- Pecan Pollen 0.2 ml (Volume)  1:20 Concentration -- Walnut, Black Pollen 0.5 ml (Volume)  1:10 Concentration -- Cat Hair   3.8  ml Extract Subtotal 1.2  ml Diluent 5.0  ml Maintenance Total  Schedule:  B  Blue Vial (1:100,000): Schedule B (6 doses) Yellow Vial (1:10,000): Schedule B (6 doses) Green Vial (1:1,000): Schedule B (6 doses) Red Vial (1:100): Schedule A (14 doses)  Special Instructions: After completion of the first Red Vial, please space to every two weeks. After completion of the second Red Vial, please space to every 4 weeks. Ok to up dose new vials at 0.35mL --> 0.3 mL --> 0.5 mL.  Ok to come twice weekly, if desired, as long as there is 48 hours between injections.

## 2024-01-11 NOTE — Progress Notes (Signed)
 VIALS EXP 01-10-25

## 2024-01-12 DIAGNOSIS — J3089 Other allergic rhinitis: Secondary | ICD-10-CM | POA: Diagnosis not present

## 2024-01-25 MED ORDER — EPINEPHRINE 0.3 MG/0.3ML IJ SOAJ: 0.3000 mg | INTRAMUSCULAR | 1 refills | Status: DC | PRN

## 2024-01-25 NOTE — Addendum Note (Signed)
Addended by: Robet Leu A on: 01/25/2024 03:08 PM   Modules accepted: Orders

## 2024-01-26 ENCOUNTER — Ambulatory Visit: Payer: Medicare HMO | Admitting: *Deleted

## 2024-01-26 DIAGNOSIS — J309 Allergic rhinitis, unspecified: Secondary | ICD-10-CM | POA: Diagnosis not present

## 2024-01-26 NOTE — Progress Notes (Signed)
Immunotherapy   Patient Details  Name: Tanner Hall MRN: [id number] Date of Birth: 02-May-1946  01/26/2024  [name] started injections for  POLLEN-CAT, MOLD-DM-R Following schedule: B  Frequency: 2 times weekly Epi-Pen:Epi-Pen Available  Consent signed and patient instructions given. Patient started allergy shots today and received 0.41mL of POLLEN-CAT in the RUA and 0.68mL of MOLD-DM-R in the LUA. Patient waited 30 minutes in office and did not experience any issues.   [name] 01/26/2024, 4:15 PM

## 2024-01-31 ENCOUNTER — Ambulatory Visit (INDEPENDENT_AMBULATORY_CARE_PROVIDER_SITE_OTHER): Payer: Self-pay

## 2024-01-31 DIAGNOSIS — J309 Allergic rhinitis, unspecified: Secondary | ICD-10-CM | POA: Diagnosis not present

## 2024-02-03 ENCOUNTER — Ambulatory Visit (INDEPENDENT_AMBULATORY_CARE_PROVIDER_SITE_OTHER): Payer: Medicare HMO

## 2024-02-03 DIAGNOSIS — J309 Allergic rhinitis, unspecified: Secondary | ICD-10-CM

## 2024-02-07 ENCOUNTER — Ambulatory Visit (INDEPENDENT_AMBULATORY_CARE_PROVIDER_SITE_OTHER): Payer: Medicare HMO | Admitting: *Deleted

## 2024-02-07 DIAGNOSIS — J309 Allergic rhinitis, unspecified: Secondary | ICD-10-CM

## 2024-02-09 ENCOUNTER — Telehealth: Payer: Self-pay

## 2024-02-09 ENCOUNTER — Ambulatory Visit (INDEPENDENT_AMBULATORY_CARE_PROVIDER_SITE_OTHER): Payer: Self-pay

## 2024-02-09 DIAGNOSIS — J309 Allergic rhinitis, unspecified: Secondary | ICD-10-CM | POA: Diagnosis not present

## 2024-02-09 MED ORDER — NEFFY 2 MG/0.1ML NA SOLN: 1.0000 | NASAL | 1 refills | Status: AC

## 2024-02-09 NOTE — Telephone Encounter (Signed)
*  Asthma/Allergy  Pharmacy Patient Advocate Encounter   Received notification from CoverMyMeds that prior authorization for Neffy 2MG /0.1ML solution  is required/requested.   Insurance verification completed.   The patient is insured through [employer] .   Per test claim: PA required; PA submitted to above mentioned insurance via CoverMyMeds Key/confirmation #/EOC [id number] Status is pending

## 2024-02-10 NOTE — Telephone Encounter (Signed)
Part D of medicare denied the pa for neffy can we send in normal epipen?

## 2024-02-10 NOTE — Telephone Encounter (Signed)
Called and left a voicemail asking for a return call to discuss.  ?

## 2024-02-13 MED ORDER — EPINEPHRINE 0.3 MG/0.3ML IJ SOAJ
0.3000 mg | Freq: Once | INTRAMUSCULAR | 1 refills | Status: AC
Start: 2024-02-13 — End: 2024-02-13

## 2024-02-13 NOTE — Telephone Encounter (Signed)
 [facility]

## 2024-02-13 NOTE — Addendum Note (Signed)
 Addended by: Berna Bue on: 02/13/2024 03:42 PM   Modules accepted: Orders

## 2024-02-14 ENCOUNTER — Ambulatory Visit (INDEPENDENT_AMBULATORY_CARE_PROVIDER_SITE_OTHER): Payer: Self-pay | Admitting: *Deleted

## 2024-02-14 DIAGNOSIS — J309 Allergic rhinitis, unspecified: Secondary | ICD-10-CM

## 2024-02-16 DIAGNOSIS — M25561 Pain in right knee: Secondary | ICD-10-CM | POA: Diagnosis not present

## 2024-02-16 DIAGNOSIS — M5416 Radiculopathy, lumbar region: Secondary | ICD-10-CM | POA: Diagnosis not present

## 2024-02-16 DIAGNOSIS — M48061 Spinal stenosis, lumbar region without neurogenic claudication: Secondary | ICD-10-CM | POA: Diagnosis not present

## 2024-02-17 ENCOUNTER — Ambulatory Visit (INDEPENDENT_AMBULATORY_CARE_PROVIDER_SITE_OTHER): Payer: Medicare HMO

## 2024-02-17 DIAGNOSIS — J309 Allergic rhinitis, unspecified: Secondary | ICD-10-CM | POA: Diagnosis not present

## 2024-02-21 ENCOUNTER — Ambulatory Visit (INDEPENDENT_AMBULATORY_CARE_PROVIDER_SITE_OTHER): Payer: Self-pay

## 2024-02-21 DIAGNOSIS — J309 Allergic rhinitis, unspecified: Secondary | ICD-10-CM

## 2024-02-27 ENCOUNTER — Ambulatory Visit (INDEPENDENT_AMBULATORY_CARE_PROVIDER_SITE_OTHER): Payer: Self-pay | Admitting: *Deleted

## 2024-02-27 DIAGNOSIS — J309 Allergic rhinitis, unspecified: Secondary | ICD-10-CM

## 2024-03-02 ENCOUNTER — Ambulatory Visit (INDEPENDENT_AMBULATORY_CARE_PROVIDER_SITE_OTHER): Payer: Self-pay | Admitting: *Deleted

## 2024-03-02 ENCOUNTER — Other Ambulatory Visit: Payer: Self-pay | Admitting: Allergy

## 2024-03-02 DIAGNOSIS — J309 Allergic rhinitis, unspecified: Secondary | ICD-10-CM | POA: Diagnosis not present

## 2024-03-05 DIAGNOSIS — M25569 Pain in unspecified knee: Secondary | ICD-10-CM | POA: Diagnosis not present

## 2024-03-05 DIAGNOSIS — E7800 Pure hypercholesterolemia, unspecified: Secondary | ICD-10-CM | POA: Diagnosis not present

## 2024-03-05 DIAGNOSIS — R635 Abnormal weight gain: Secondary | ICD-10-CM | POA: Diagnosis not present

## 2024-03-05 DIAGNOSIS — I10 Essential (primary) hypertension: Secondary | ICD-10-CM | POA: Diagnosis not present

## 2024-03-05 DIAGNOSIS — Z6837 Body mass index (BMI) 37.0-37.9, adult: Secondary | ICD-10-CM | POA: Diagnosis not present

## 2024-03-05 DIAGNOSIS — E1169 Type 2 diabetes mellitus with other specified complication: Secondary | ICD-10-CM | POA: Diagnosis not present

## 2024-03-05 DIAGNOSIS — Z79899 Other long term (current) drug therapy: Secondary | ICD-10-CM | POA: Diagnosis not present

## 2024-03-05 DIAGNOSIS — M5430 Sciatica, unspecified side: Secondary | ICD-10-CM | POA: Diagnosis not present

## 2024-03-05 DIAGNOSIS — R972 Elevated prostate specific antigen [PSA]: Secondary | ICD-10-CM | POA: Diagnosis not present

## 2024-03-06 ENCOUNTER — Ambulatory Visit (INDEPENDENT_AMBULATORY_CARE_PROVIDER_SITE_OTHER): Payer: Self-pay | Admitting: *Deleted

## 2024-03-06 DIAGNOSIS — J309 Allergic rhinitis, unspecified: Secondary | ICD-10-CM | POA: Diagnosis not present

## 2024-03-09 ENCOUNTER — Ambulatory Visit (INDEPENDENT_AMBULATORY_CARE_PROVIDER_SITE_OTHER): Payer: Self-pay | Admitting: *Deleted

## 2024-03-09 DIAGNOSIS — J309 Allergic rhinitis, unspecified: Secondary | ICD-10-CM

## 2024-03-13 ENCOUNTER — Ambulatory Visit (INDEPENDENT_AMBULATORY_CARE_PROVIDER_SITE_OTHER): Payer: Self-pay | Admitting: *Deleted

## 2024-03-13 DIAGNOSIS — M25561 Pain in right knee: Secondary | ICD-10-CM | POA: Diagnosis not present

## 2024-03-13 DIAGNOSIS — J309 Allergic rhinitis, unspecified: Secondary | ICD-10-CM | POA: Diagnosis not present

## 2024-03-16 ENCOUNTER — Ambulatory Visit (INDEPENDENT_AMBULATORY_CARE_PROVIDER_SITE_OTHER): Payer: Self-pay

## 2024-03-16 DIAGNOSIS — J309 Allergic rhinitis, unspecified: Secondary | ICD-10-CM

## 2024-03-20 ENCOUNTER — Ambulatory Visit (INDEPENDENT_AMBULATORY_CARE_PROVIDER_SITE_OTHER): Payer: Self-pay | Admitting: *Deleted

## 2024-03-20 DIAGNOSIS — J309 Allergic rhinitis, unspecified: Secondary | ICD-10-CM | POA: Diagnosis not present

## 2024-03-23 ENCOUNTER — Ambulatory Visit (INDEPENDENT_AMBULATORY_CARE_PROVIDER_SITE_OTHER): Payer: Self-pay

## 2024-03-23 DIAGNOSIS — J309 Allergic rhinitis, unspecified: Secondary | ICD-10-CM | POA: Diagnosis not present

## 2024-03-27 ENCOUNTER — Ambulatory Visit (INDEPENDENT_AMBULATORY_CARE_PROVIDER_SITE_OTHER): Payer: Self-pay | Admitting: *Deleted

## 2024-03-27 DIAGNOSIS — J309 Allergic rhinitis, unspecified: Secondary | ICD-10-CM

## 2024-03-29 ENCOUNTER — Ambulatory Visit (INDEPENDENT_AMBULATORY_CARE_PROVIDER_SITE_OTHER): Payer: Self-pay

## 2024-03-29 DIAGNOSIS — J309 Allergic rhinitis, unspecified: Secondary | ICD-10-CM | POA: Diagnosis not present

## 2024-04-03 ENCOUNTER — Ambulatory Visit (INDEPENDENT_AMBULATORY_CARE_PROVIDER_SITE_OTHER): Payer: Self-pay

## 2024-04-03 DIAGNOSIS — J309 Allergic rhinitis, unspecified: Secondary | ICD-10-CM | POA: Diagnosis not present

## 2024-04-06 ENCOUNTER — Ambulatory Visit (INDEPENDENT_AMBULATORY_CARE_PROVIDER_SITE_OTHER)

## 2024-04-06 DIAGNOSIS — J309 Allergic rhinitis, unspecified: Secondary | ICD-10-CM

## 2024-04-10 ENCOUNTER — Ambulatory Visit (INDEPENDENT_AMBULATORY_CARE_PROVIDER_SITE_OTHER): Payer: Self-pay

## 2024-04-10 DIAGNOSIS — J309 Allergic rhinitis, unspecified: Secondary | ICD-10-CM

## 2024-04-25 ENCOUNTER — Ambulatory Visit (INDEPENDENT_AMBULATORY_CARE_PROVIDER_SITE_OTHER)

## 2024-04-25 DIAGNOSIS — J309 Allergic rhinitis, unspecified: Secondary | ICD-10-CM | POA: Diagnosis not present

## 2024-04-27 ENCOUNTER — Ambulatory Visit (INDEPENDENT_AMBULATORY_CARE_PROVIDER_SITE_OTHER): Payer: Self-pay

## 2024-04-27 DIAGNOSIS — J309 Allergic rhinitis, unspecified: Secondary | ICD-10-CM | POA: Diagnosis not present

## 2024-05-01 ENCOUNTER — Ambulatory Visit (INDEPENDENT_AMBULATORY_CARE_PROVIDER_SITE_OTHER): Payer: Self-pay

## 2024-05-01 DIAGNOSIS — J309 Allergic rhinitis, unspecified: Secondary | ICD-10-CM

## 2024-05-09 ENCOUNTER — Ambulatory Visit (INDEPENDENT_AMBULATORY_CARE_PROVIDER_SITE_OTHER)

## 2024-05-09 DIAGNOSIS — J309 Allergic rhinitis, unspecified: Secondary | ICD-10-CM

## 2024-05-15 DIAGNOSIS — M48061 Spinal stenosis, lumbar region without neurogenic claudication: Secondary | ICD-10-CM | POA: Diagnosis not present

## 2024-05-15 DIAGNOSIS — M5416 Radiculopathy, lumbar region: Secondary | ICD-10-CM | POA: Diagnosis not present

## 2024-05-18 ENCOUNTER — Ambulatory Visit (INDEPENDENT_AMBULATORY_CARE_PROVIDER_SITE_OTHER)

## 2024-05-18 DIAGNOSIS — J309 Allergic rhinitis, unspecified: Secondary | ICD-10-CM

## 2024-05-25 ENCOUNTER — Ambulatory Visit (INDEPENDENT_AMBULATORY_CARE_PROVIDER_SITE_OTHER): Payer: Self-pay

## 2024-05-25 DIAGNOSIS — J309 Allergic rhinitis, unspecified: Secondary | ICD-10-CM

## 2024-05-30 ENCOUNTER — Ambulatory Visit (INDEPENDENT_AMBULATORY_CARE_PROVIDER_SITE_OTHER): Payer: Self-pay

## 2024-05-30 DIAGNOSIS — J309 Allergic rhinitis, unspecified: Secondary | ICD-10-CM

## 2024-06-07 ENCOUNTER — Ambulatory Visit (INDEPENDENT_AMBULATORY_CARE_PROVIDER_SITE_OTHER): Payer: Self-pay

## 2024-06-07 DIAGNOSIS — J309 Allergic rhinitis, unspecified: Secondary | ICD-10-CM | POA: Diagnosis not present

## 2024-06-15 ENCOUNTER — Ambulatory Visit (INDEPENDENT_AMBULATORY_CARE_PROVIDER_SITE_OTHER): Payer: Self-pay

## 2024-06-15 DIAGNOSIS — J309 Allergic rhinitis, unspecified: Secondary | ICD-10-CM | POA: Diagnosis not present

## 2024-06-16 DIAGNOSIS — M545 Low back pain, unspecified: Secondary | ICD-10-CM | POA: Diagnosis not present

## 2024-06-19 DIAGNOSIS — M47816 Spondylosis without myelopathy or radiculopathy, lumbar region: Secondary | ICD-10-CM | POA: Diagnosis not present

## 2024-06-19 DIAGNOSIS — M5416 Radiculopathy, lumbar region: Secondary | ICD-10-CM | POA: Diagnosis not present

## 2024-06-19 DIAGNOSIS — M533 Sacrococcygeal disorders, not elsewhere classified: Secondary | ICD-10-CM | POA: Diagnosis not present

## 2024-06-19 DIAGNOSIS — M791 Myalgia, unspecified site: Secondary | ICD-10-CM | POA: Diagnosis not present

## 2024-06-20 ENCOUNTER — Ambulatory Visit (INDEPENDENT_AMBULATORY_CARE_PROVIDER_SITE_OTHER): Payer: Self-pay

## 2024-06-20 DIAGNOSIS — S76312D Strain of muscle, fascia and tendon of the posterior muscle group at thigh level, left thigh, subsequent encounter: Secondary | ICD-10-CM | POA: Diagnosis not present

## 2024-06-20 DIAGNOSIS — J309 Allergic rhinitis, unspecified: Secondary | ICD-10-CM | POA: Diagnosis not present

## 2024-06-20 DIAGNOSIS — M47816 Spondylosis without myelopathy or radiculopathy, lumbar region: Secondary | ICD-10-CM | POA: Diagnosis not present

## 2024-06-26 ENCOUNTER — Ambulatory Visit (INDEPENDENT_AMBULATORY_CARE_PROVIDER_SITE_OTHER)

## 2024-06-26 DIAGNOSIS — M47816 Spondylosis without myelopathy or radiculopathy, lumbar region: Secondary | ICD-10-CM | POA: Diagnosis not present

## 2024-06-26 DIAGNOSIS — J309 Allergic rhinitis, unspecified: Secondary | ICD-10-CM | POA: Diagnosis not present

## 2024-06-26 DIAGNOSIS — S76312D Strain of muscle, fascia and tendon of the posterior muscle group at thigh level, left thigh, subsequent encounter: Secondary | ICD-10-CM | POA: Diagnosis not present

## 2024-07-03 DIAGNOSIS — M533 Sacrococcygeal disorders, not elsewhere classified: Secondary | ICD-10-CM | POA: Diagnosis not present

## 2024-07-03 DIAGNOSIS — M791 Myalgia, unspecified site: Secondary | ICD-10-CM | POA: Diagnosis not present

## 2024-07-03 DIAGNOSIS — M47816 Spondylosis without myelopathy or radiculopathy, lumbar region: Secondary | ICD-10-CM | POA: Diagnosis not present

## 2024-07-03 DIAGNOSIS — M5416 Radiculopathy, lumbar region: Secondary | ICD-10-CM | POA: Diagnosis not present

## 2024-07-04 DIAGNOSIS — S76312D Strain of muscle, fascia and tendon of the posterior muscle group at thigh level, left thigh, subsequent encounter: Secondary | ICD-10-CM | POA: Diagnosis not present

## 2024-07-04 DIAGNOSIS — M47816 Spondylosis without myelopathy or radiculopathy, lumbar region: Secondary | ICD-10-CM | POA: Diagnosis not present

## 2024-07-05 ENCOUNTER — Ambulatory Visit

## 2024-07-05 DIAGNOSIS — J309 Allergic rhinitis, unspecified: Secondary | ICD-10-CM

## 2024-07-06 ENCOUNTER — Other Ambulatory Visit: Payer: Self-pay | Admitting: *Deleted

## 2024-07-06 ENCOUNTER — Other Ambulatory Visit: Payer: Self-pay

## 2024-07-06 MED ORDER — LEVOCETIRIZINE DIHYDROCHLORIDE 5 MG PO TABS: 5.0000 mg | ORAL_TABLET | Freq: Every day | ORAL | 1 refills | Status: DC | PRN

## 2024-07-06 NOTE — Telephone Encounter (Signed)
 Received refill request from [facility] for Levocetirizine. Pt has appt on 7/16.

## 2024-07-09 DIAGNOSIS — J3081 Allergic rhinitis due to animal (cat) (dog) hair and dander: Secondary | ICD-10-CM | POA: Diagnosis not present

## 2024-07-10 DIAGNOSIS — J3089 Other allergic rhinitis: Secondary | ICD-10-CM | POA: Diagnosis not present

## 2024-07-10 NOTE — Progress Notes (Signed)
 VIALS MADE 07-10-24

## 2024-07-11 ENCOUNTER — Encounter: Payer: Self-pay | Admitting: Allergy

## 2024-07-11 ENCOUNTER — Other Ambulatory Visit: Payer: Self-pay

## 2024-07-11 ENCOUNTER — Ambulatory Visit: Payer: Medicare HMO | Admitting: Allergy

## 2024-07-11 ENCOUNTER — Other Ambulatory Visit: Payer: Self-pay | Admitting: Allergy

## 2024-07-11 ENCOUNTER — Ambulatory Visit

## 2024-07-11 VITALS — BP 124/62 | HR 70 | Temp 97.6°F | Wt 261.0 lb

## 2024-07-11 DIAGNOSIS — J302 Other seasonal allergic rhinitis: Secondary | ICD-10-CM

## 2024-07-11 DIAGNOSIS — J3089 Other allergic rhinitis: Secondary | ICD-10-CM | POA: Diagnosis not present

## 2024-07-11 DIAGNOSIS — R0982 Postnasal drip: Secondary | ICD-10-CM | POA: Diagnosis not present

## 2024-07-11 MED ORDER — IPRATROPIUM BROMIDE 0.06 % NA SOLN: 2.0000 | NASAL | 2 refills | Status: AC | PRN

## 2024-07-11 MED ORDER — NEFFY 2 MG/0.1ML NA SOLN: 1.0000 | NASAL | 1 refills | Status: DC | PRN

## 2024-07-11 MED ORDER — LEVOCETIRIZINE DIHYDROCHLORIDE 5 MG PO TABS: 5.0000 mg | ORAL_TABLET | Freq: Every day | ORAL | 1 refills | Status: AC | PRN

## 2024-07-11 NOTE — Patient Instructions (Addendum)
-    Continue avoidance measures grasses, ragweed, weeds, trees, indoor molds, outdoor molds, dust mites, cat, and cockroach - Use Xyzal  (levocetirizine) 5mg  tablet once daily as needed Atrovent  (ipratropium) 0.06% 1-2 sprays per nostril 3-4 times daily as needed (CAN BE OVER DRYING) for nasal drainage - You can use an extra dose of the antihistamine, if needed, for breakthrough symptoms.  - Continue nasal saline rinses 1-2 times daily to remove allergens from the nasal cavities as well as help with mucous clearance (this is especially helpful to do before the nasal sprays are given) - Continue allergy  shots per protocol.  You are in maintenance dosing.  Recommend have access to epinephrine  device on your allergy  shot days.  We will try for the nasal epinephrine  device, Neffy , Freda Maczis to see if this is more cost effective.  Follow-up in 6 months or sooner if needed

## 2024-07-11 NOTE — Progress Notes (Signed)
 Follow-up Note  RE: MILIK GILREATH MRN: [id number] DOB: 09/07/1946 Date of Office Visit: 07/11/2024   History of present illness: Tanner Hall is a 78 y.o. male presenting today for follow-up of allergic rhinitis and postnasal drip.  He was last seen in the office on 01/04/2024 by myself. Discussed the use of AI scribe software for clinical note transcription with the patient, who gave verbal consent to proceed.  He has transitioned to maintenance dosing of allergy  shots, now receiving them every two weeks. There is significant improvement in his symptoms, particularly during the spring pollen season.  He is not having any large local or systemic reactions to the allergy  shots.  He currently does not have access to an epinephrine  device due to cost.  He takes Levocetirizine (Xyzal ) once daily, usually after dinner, but experiences postnasal drip and throat clearing around 4 PM. He uses a nasal spray, ipratropium, less frequently than before, primarily at night if he experiences a runny nose.  He mentions a history of coughing and hacking in the mornings due to postnasal drip, which has improved since starting allergy  shots.      Review of systems: 10pt ROS negative unless noted above in HPI  Past medical/social/surgical/family history have been reviewed and are unchanged unless specifically indicated below.  No changes  Medication List: Current Outpatient Medications  Medication Sig Dispense Refill   amLODipine (NORVASC) 2.5 MG tablet      aspirin-acetaminophen-caffeine (EXCEDRIN MIGRAINE) 250-250-65 MG per tablet Take 2 tablets by mouth every 6 (six) hours as needed for headache.     celecoxib (CELEBREX) 200 MG capsule Take 200 mg by mouth 2 (two) times daily.     EPINEPHrine  (NEFFY ) 2 MG/0.1ML SOLN Place 1 spray into the nose as needed. 4 each 1   finasteride (PROSCAR) 5 MG tablet Take 5 mg by mouth daily.  12   magnesium oxide (MAG-OX) 400 MG tablet Take 400 mg by mouth as needed.  (Patient taking differently: Take 400 mg by mouth 2 (two) times daily.)     pravastatin (PRAVACHOL) 10 MG tablet Take 10 mg by mouth once a week.     predniSONE  (STERAPRED UNI-PAK 21 TAB) 10 MG (21) TBPK tablet Take 10 mg by mouth as needed.     pregabalin  (LYRICA ) 75 MG capsule One po qAM and two po qHS 90 capsule 5   RYBELSUS 7 MG TABS Take 1 tablet by mouth daily.     sildenafil (VIAGRA) 100 MG tablet Take 100 mg by mouth daily as needed for erectile dysfunction.     tiZANidine (ZANAFLEX) 2 MG tablet Take 4 mg by mouth at bedtime.     traMADol (ULTRAM) 50 MG tablet Take by mouth every 6 (six) hours as needed.     EPINEPHrine  (NEFFY ) 2 MG/0.1ML SOLN Place 1 spray into the nose as directed. 2 each 1   fexofenadine-pseudoephedrine (ALLEGRA-D 24) 180-240 MG per 24 hr tablet Take 1 tablet by mouth as needed. Alternates with other allergy  meds (Patient not taking: Reported on 07/11/2024)     ipratropium (ATROVENT ) 0.06 % nasal spray Place 2 sprays into both nostrils as needed. 15 mL 2   irbesartan (AVAPRO) 300 MG tablet      levocetirizine (XYZAL ) 5 MG tablet Take 1 tablet (5 mg total) by mouth daily as needed for allergies. 90 tablet 1   Multiple Vitamin (MULTI-VITAMIN) tablet Take 1 tablet by mouth daily. (Patient not taking: Reported on 07/11/2024)     No  current facility-administered medications for this visit.     Known medication allergies: Allergies  Allergen Reactions   Dust Mite Extract    Pollen Extract-Tree Extract [Pollen Extract]      Physical examination: Blood pressure 124/62, pulse 70, temperature 97.6 F (36.4 C), temperature source Temporal, weight 261 lb (118.4 kg), SpO2 95%.  General: Alert, interactive, in no acute distress. HEENT: PERRLA, TMs pearly gray, turbinates non-edematous without discharge, post-pharynx non erythematous. Neck: Supple without lymphadenopathy. Lungs: Clear to auscultation without wheezing, rhonchi or rales. {no increased work of  breathing. CV: Normal S1, S2 without murmurs. Abdomen: Nondistended, nontender. Skin: Warm and dry, without lesions or rashes. Extremities:  No clubbing, cyanosis or edema. Neuro:   Grossly intact.  Diagnostics/Labs: Immunotherapy injections given today   Assessment and plan: Allergic rhinitis Postnasal drip -  Continue avoidance measures grasses, ragweed, weeds, trees, indoor molds, outdoor molds, dust mites, cat, and cockroach - Use Xyzal  (levocetirizine) 5mg  tablet once daily as needed Atrovent  (ipratropium) 0.06% 1-2 sprays per nostril 3-4 times daily as needed (CAN BE OVER DRYING) for nasal drainage - You can use an extra dose of the antihistamine, if needed, for breakthrough symptoms.  - Continue nasal saline rinses 1-2 times daily to remove allergens from the nasal cavities as well as help with mucous clearance (this is especially helpful to do before the nasal sprays are given) - Continue allergy  shots per protocol.  You are in maintenance dosing.  Recommend have access to epinephrine  device on your allergy  shot days.  We will try for the nasal epinephrine  device, Neffy , Freda Maczis to see if this is more cost effective.  Follow-up in 6 months or sooner if needed  I appreciate the opportunity to take part in Gerod's care. Please do not hesitate to contact me with questions.  Sincerely,   Danita Brain, MD Allergy /Immunology [facility]

## 2024-07-19 DIAGNOSIS — M47816 Spondylosis without myelopathy or radiculopathy, lumbar region: Secondary | ICD-10-CM | POA: Diagnosis not present

## 2024-07-19 DIAGNOSIS — S76312D Strain of muscle, fascia and tendon of the posterior muscle group at thigh level, left thigh, subsequent encounter: Secondary | ICD-10-CM | POA: Diagnosis not present

## 2024-07-24 DIAGNOSIS — M47816 Spondylosis without myelopathy or radiculopathy, lumbar region: Secondary | ICD-10-CM | POA: Diagnosis not present

## 2024-07-24 DIAGNOSIS — M5416 Radiculopathy, lumbar region: Secondary | ICD-10-CM | POA: Diagnosis not present

## 2024-07-24 DIAGNOSIS — M791 Myalgia, unspecified site: Secondary | ICD-10-CM | POA: Diagnosis not present

## 2024-07-24 DIAGNOSIS — M533 Sacrococcygeal disorders, not elsewhere classified: Secondary | ICD-10-CM | POA: Diagnosis not present

## 2024-07-25 ENCOUNTER — Ambulatory Visit (INDEPENDENT_AMBULATORY_CARE_PROVIDER_SITE_OTHER)

## 2024-07-25 DIAGNOSIS — J309 Allergic rhinitis, unspecified: Secondary | ICD-10-CM

## 2024-07-25 DIAGNOSIS — S76312D Strain of muscle, fascia and tendon of the posterior muscle group at thigh level, left thigh, subsequent encounter: Secondary | ICD-10-CM | POA: Diagnosis not present

## 2024-07-25 DIAGNOSIS — M47816 Spondylosis without myelopathy or radiculopathy, lumbar region: Secondary | ICD-10-CM | POA: Diagnosis not present

## 2024-08-08 ENCOUNTER — Ambulatory Visit

## 2024-08-08 DIAGNOSIS — J309 Allergic rhinitis, unspecified: Secondary | ICD-10-CM | POA: Diagnosis not present

## 2024-08-09 DIAGNOSIS — M47816 Spondylosis without myelopathy or radiculopathy, lumbar region: Secondary | ICD-10-CM | POA: Diagnosis not present

## 2024-08-09 DIAGNOSIS — S76312D Strain of muscle, fascia and tendon of the posterior muscle group at thigh level, left thigh, subsequent encounter: Secondary | ICD-10-CM | POA: Diagnosis not present

## 2024-08-13 DIAGNOSIS — M47816 Spondylosis without myelopathy or radiculopathy, lumbar region: Secondary | ICD-10-CM | POA: Diagnosis not present

## 2024-08-14 DIAGNOSIS — S76312D Strain of muscle, fascia and tendon of the posterior muscle group at thigh level, left thigh, subsequent encounter: Secondary | ICD-10-CM | POA: Diagnosis not present

## 2024-08-14 DIAGNOSIS — M47816 Spondylosis without myelopathy or radiculopathy, lumbar region: Secondary | ICD-10-CM | POA: Diagnosis not present

## 2024-08-15 ENCOUNTER — Ambulatory Visit (INDEPENDENT_AMBULATORY_CARE_PROVIDER_SITE_OTHER)

## 2024-08-15 DIAGNOSIS — J309 Allergic rhinitis, unspecified: Secondary | ICD-10-CM | POA: Diagnosis not present

## 2024-08-21 DIAGNOSIS — M47816 Spondylosis without myelopathy or radiculopathy, lumbar region: Secondary | ICD-10-CM | POA: Diagnosis not present

## 2024-08-21 DIAGNOSIS — S76312D Strain of muscle, fascia and tendon of the posterior muscle group at thigh level, left thigh, subsequent encounter: Secondary | ICD-10-CM | POA: Diagnosis not present

## 2024-08-22 ENCOUNTER — Ambulatory Visit (INDEPENDENT_AMBULATORY_CARE_PROVIDER_SITE_OTHER)

## 2024-08-22 DIAGNOSIS — I10 Essential (primary) hypertension: Secondary | ICD-10-CM | POA: Diagnosis not present

## 2024-08-22 DIAGNOSIS — J309 Allergic rhinitis, unspecified: Secondary | ICD-10-CM | POA: Diagnosis not present

## 2024-08-22 DIAGNOSIS — R972 Elevated prostate specific antigen [PSA]: Secondary | ICD-10-CM | POA: Diagnosis not present

## 2024-08-22 DIAGNOSIS — E1169 Type 2 diabetes mellitus with other specified complication: Secondary | ICD-10-CM | POA: Diagnosis not present

## 2024-08-22 DIAGNOSIS — E7800 Pure hypercholesterolemia, unspecified: Secondary | ICD-10-CM | POA: Diagnosis not present

## 2024-08-29 DIAGNOSIS — M549 Dorsalgia, unspecified: Secondary | ICD-10-CM | POA: Diagnosis not present

## 2024-08-29 DIAGNOSIS — Z1331 Encounter for screening for depression: Secondary | ICD-10-CM | POA: Diagnosis not present

## 2024-08-29 DIAGNOSIS — Z0000 Encounter for general adult medical examination without abnormal findings: Secondary | ICD-10-CM | POA: Diagnosis not present

## 2024-08-29 DIAGNOSIS — M5430 Sciatica, unspecified side: Secondary | ICD-10-CM | POA: Diagnosis not present

## 2024-08-29 DIAGNOSIS — J309 Allergic rhinitis, unspecified: Secondary | ICD-10-CM | POA: Diagnosis not present

## 2024-08-29 DIAGNOSIS — I10 Essential (primary) hypertension: Secondary | ICD-10-CM | POA: Diagnosis not present

## 2024-08-29 DIAGNOSIS — E1169 Type 2 diabetes mellitus with other specified complication: Secondary | ICD-10-CM | POA: Diagnosis not present

## 2024-08-29 DIAGNOSIS — E7800 Pure hypercholesterolemia, unspecified: Secondary | ICD-10-CM | POA: Diagnosis not present

## 2024-08-30 DIAGNOSIS — S76312D Strain of muscle, fascia and tendon of the posterior muscle group at thigh level, left thigh, subsequent encounter: Secondary | ICD-10-CM | POA: Diagnosis not present

## 2024-08-30 DIAGNOSIS — M47816 Spondylosis without myelopathy or radiculopathy, lumbar region: Secondary | ICD-10-CM | POA: Diagnosis not present

## 2024-09-06 DIAGNOSIS — M47816 Spondylosis without myelopathy or radiculopathy, lumbar region: Secondary | ICD-10-CM | POA: Diagnosis not present

## 2024-09-06 DIAGNOSIS — M5416 Radiculopathy, lumbar region: Secondary | ICD-10-CM | POA: Diagnosis not present

## 2024-09-06 DIAGNOSIS — S76312D Strain of muscle, fascia and tendon of the posterior muscle group at thigh level, left thigh, subsequent encounter: Secondary | ICD-10-CM | POA: Diagnosis not present

## 2024-09-26 ENCOUNTER — Ambulatory Visit

## 2024-09-26 DIAGNOSIS — J309 Allergic rhinitis, unspecified: Secondary | ICD-10-CM

## 2024-10-01 ENCOUNTER — Ambulatory Visit: Admitting: Podiatry

## 2024-10-02 DIAGNOSIS — M47816 Spondylosis without myelopathy or radiculopathy, lumbar region: Secondary | ICD-10-CM | POA: Diagnosis not present

## 2024-10-02 DIAGNOSIS — S76312D Strain of muscle, fascia and tendon of the posterior muscle group at thigh level, left thigh, subsequent encounter: Secondary | ICD-10-CM | POA: Diagnosis not present

## 2024-10-15 DIAGNOSIS — M47816 Spondylosis without myelopathy or radiculopathy, lumbar region: Secondary | ICD-10-CM | POA: Diagnosis not present

## 2024-10-15 DIAGNOSIS — S76312D Strain of muscle, fascia and tendon of the posterior muscle group at thigh level, left thigh, subsequent encounter: Secondary | ICD-10-CM | POA: Diagnosis not present

## 2024-10-18 DIAGNOSIS — M47816 Spondylosis without myelopathy or radiculopathy, lumbar region: Secondary | ICD-10-CM | POA: Diagnosis not present

## 2024-10-23 ENCOUNTER — Ambulatory Visit

## 2024-10-23 DIAGNOSIS — J309 Allergic rhinitis, unspecified: Secondary | ICD-10-CM | POA: Diagnosis not present

## 2024-10-23 DIAGNOSIS — J3089 Other allergic rhinitis: Secondary | ICD-10-CM

## 2024-10-24 DIAGNOSIS — G4733 Obstructive sleep apnea (adult) (pediatric): Secondary | ICD-10-CM | POA: Diagnosis not present

## 2024-10-24 DIAGNOSIS — I10 Essential (primary) hypertension: Secondary | ICD-10-CM | POA: Diagnosis not present

## 2024-10-24 DIAGNOSIS — J309 Allergic rhinitis, unspecified: Secondary | ICD-10-CM | POA: Diagnosis not present

## 2024-10-30 DIAGNOSIS — M47816 Spondylosis without myelopathy or radiculopathy, lumbar region: Secondary | ICD-10-CM | POA: Diagnosis not present

## 2024-10-30 DIAGNOSIS — S76312D Strain of muscle, fascia and tendon of the posterior muscle group at thigh level, left thigh, subsequent encounter: Secondary | ICD-10-CM | POA: Diagnosis not present

## 2024-11-08 DIAGNOSIS — Z6836 Body mass index (BMI) 36.0-36.9, adult: Secondary | ICD-10-CM | POA: Diagnosis not present

## 2024-11-08 DIAGNOSIS — R42 Dizziness and giddiness: Secondary | ICD-10-CM | POA: Diagnosis not present

## 2024-11-13 DIAGNOSIS — S76312D Strain of muscle, fascia and tendon of the posterior muscle group at thigh level, left thigh, subsequent encounter: Secondary | ICD-10-CM | POA: Diagnosis not present

## 2024-11-13 DIAGNOSIS — M47816 Spondylosis without myelopathy or radiculopathy, lumbar region: Secondary | ICD-10-CM | POA: Diagnosis not present

## 2024-11-19 DIAGNOSIS — L821 Other seborrheic keratosis: Secondary | ICD-10-CM | POA: Diagnosis not present

## 2024-11-19 DIAGNOSIS — L814 Other melanin hyperpigmentation: Secondary | ICD-10-CM | POA: Diagnosis not present

## 2024-11-19 DIAGNOSIS — D692 Other nonthrombocytopenic purpura: Secondary | ICD-10-CM | POA: Diagnosis not present

## 2024-11-19 DIAGNOSIS — L570 Actinic keratosis: Secondary | ICD-10-CM | POA: Diagnosis not present

## 2024-11-19 NOTE — Therapy (Signed)
 OUTPATIENT PHYSICAL THERAPY VESTIBULAR EVALUATION     Patient Name: Tanner Hall MRN: [id number] DOB:1946/04/25, 78 y.o., male Today's Date: 11/20/2024  END OF SESSION:  PT End of Session - 11/20/24 1646     Visit Number 1    Number of Visits 5    Date for Recertification  12/18/24    Authorization Type [employer]    Authorization Time Period auth submitted    PT Start Time 1602    PT Stop Time 1641    PT Time Calculation (min) 39 min    Equipment Utilized During Treatment Gait belt    Activity Tolerance Patient tolerated treatment well    Behavior During Therapy WFL for tasks assessed/performed          Past Medical History:  Diagnosis Date   AAA (abdominal aortic aneurysm)    3.1 cm   BPH (benign prostatic hyperplasia)    Diverticulitis    GI bleed    Hematuria    PVD (peripheral vascular disease)    Past Surgical History:  Procedure Laterality Date   ADENOIDECTOMY     COLONOSCOPY  06/27/2011   CYSTOSCOPY FOR HEMATURIA     NO CANCER FOUND   DOUBLE INGUINAL REPAIR  12/27/1996   KNEE SURGERY     LT PAROTID MASS  05/27/2013   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   PARTIAL COLECTOMY FOR DIVERTICULITIS  10/27/2006   TONSILLECTOMY     Patient Active Problem List   Diagnosis Date Noted   Bilateral hand pain 07/11/2014   Bilateral knee pain 07/11/2014   Low back pain 07/11/2014   OA (osteoarthritis) 07/11/2014    PCP: [name], MD  REFERRING PROVIDER: [name], MD   REFERRING DIAG: R42 (ICD-10-CM) - Vertigo  THERAPY DIAG:  BPPV (benign paroxysmal positional vertigo), bilateral  ONSET DATE: a couple weeks ago   Rationale for Evaluation and Treatment: Rehabilitation  SUBJECTIVE:   SUBJECTIVE STATEMENT: A couple weeks ago after a few bourbons he went to bed, rolled over and the whole room was rolling. Feels a little wobbly upon sitting up as well. Reports that his BP has been good. Denies head trauma, infection/illness,  vision changes/double vision, migraines, balance changes.    Pt accompanied by: self  PERTINENT HISTORY: AAA, PVD, L parotid mass  PAIN:  Are you having pain? No; pt reports a bad back  PRECAUTIONS: Fall  RED FLAGS: None   WEIGHT BEARING RESTRICTIONS: No  FALLS: Has patient fallen in last 6 months? No  LIVING ENVIRONMENT: Lives with: lives with their spouse Lives in: House/apartment Stairs: steps to get inside with handrail  PLOF: Independent, Vocation/Vocational requirements: retired , and Leisure: yardwork   PATIENT GOALS: improve dizziness   OBJECTIVE:  Note: Objective measures were completed at Evaluation unless otherwise noted.  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   POSTURE:  rounded shoulders and forward head  GAIT: Gait pattern: WFL Assistive device utilized: None Level of assistance: Complete Independence   PATIENT SURVEYS:  DHI: 6/100  VESTIBULAR ASSESSMENT   GENERAL OBSERVATION: pt wears glasses for astigmatism    OCULOMOTOR EXAM: Ocular Alignment: Cover/Uncover Test: WNL Cover/Cross Cover Test: WNL   Ocular ROM: No Limitations   Spontaneous Nystagmus: absent   Gaze-Induced Nystagmus: absent   Smooth Pursuits: intact   Saccades: intact   Convergence/Divergence: 0 cm    VESTIBULAR - OCULAR  REFLEX:    Slow VOR: Normal   VOR Cancellation: Normal   Head-Impulse Test: HIT Right: negative HIT Left: covert positive       POSITIONAL TESTING:  Supine>sit: small amplitude nystagmus difficult to discern; and c/o dizziness  Right Roll Test: negative Left Roll Test: negative   Right Loaded Dix-Hallpike: small amplitude upbeating (unable to visualize if torsion) lasting for 10 sec Left Loaded Dix-Hallpike: large amplitude upbeating torsional nystagmus lasting 20 sec                                                                                                                               TREATMENT DATE: 11/20/24   Canalith Repositioning:  Epley Left: Number of Reps: 1 and Comment: tolerated well   Upon retest of L DH: small amplitude L upbeating lasting 15 sec but pt asymptomatic    PATIENT EDUCATION: Education details: prognosis, POC, edu on BPPV and risk factors and handout; edu on Meclizine as vestibular suppressant  Person educated: Patient Education method: Explanation, Demonstration, Tactile cues, Verbal cues, and Handouts Education comprehension: verbalized understanding and returned demonstration  HOME EXERCISE PROGRAM:  GOALS: Goals reviewed with patient? Yes  SHORT TERM GOALS: Target date: 12/04/2024  Patient to be independent with initial HEP. Baseline: HEP initiated Goal status: INITIAL    LONG TERM GOALS: Target date: 12/18/2024  Patient to be independent with advanced HEP. Baseline: Not yet initiated  Goal status: INITIAL  Patient will report 0/10 dizziness with bed mobility.  Baseline: Symptomatic  Goal status: INITIAL  Patient to score 0 on DHI in order to improve functional outcomes.  Baseline: 6/100 Goal status: INITIAL   ASSESSMENT:  CLINICAL IMPRESSION:  Patient is a 78 y/o M presenting to OPPT with c/o dizziness for the past couple weeks. Denies head trauma, infection/illness, vision changes/double vision, migraines, balance changes. Patient today presenting with Covert positive L HIT and positive L>R DH. Patient was treated with L Epley with good improvement in symptoms. Would benefit from skilled PT services 1x/week for 4 weeks to address remaining impairments. .    OBJECTIVE IMPAIRMENTS: decreased activity tolerance and dizziness.   ACTIVITY LIMITATIONS: bending, sleeping, and transfers  PARTICIPATION LIMITATIONS: cleaning and laundry  PERSONAL FACTORS: Age, Past/current experiences, Time since onset of injury/illness/exacerbation, and 3+  comorbidities: AAA, PVD, L parotid mass are also affecting patient's functional outcome.   REHAB POTENTIAL: Good  CLINICAL DECISION MAKING: Evolving/moderate complexity  EVALUATION COMPLEXITY: Moderate   PLAN:  PT FREQUENCY: 1x/week  PT DURATION: 4 weeks  PLANNED INTERVENTIONS: 97164- PT Re-evaluation, 97110-Therapeutic exercises, 97530- Therapeutic activity, 97112- Neuromuscular re-education, 97535- Self Care, 02859- Manual therapy, 97116- Gait training, [phone]- Canalith repositioning, Patient/Family education, Balance training, Stair training, Taping, Vestibular training, Cryotherapy, and Moist heat  PLAN FOR NEXT SESSION: recheck R and L DH   Referring diagnosis:  R42 (ICD-10-CM) - Vertigo Treatment diagnosis (if different than referring diagnosis): BPPV (benign paroxysmal positional vertigo), bilateral Date Symptoms Began: 11/12/24 #  of Visits requested: 5  Time period for Authorization: 11/20/24 to 12/18/24  What was this (referring dx) caused by? []  Surgery []  Fall []  Ongoing issue []  Arthritis [x]  Other: __insidious__________  Laterality: []  Rt []  Lt [x]  Both  Functional Tool & Score: [facility] 6/100  Check all possible CPT codes:     See Planned Interventions listed in the Plan section of the Evaluation.     If [employer]: Choose 10 or less codes  If [employer] Medicaid: Modalities are not covered  If [employer]: Check allowed ICD code combinations   If [facility] Plan or Cigna: Cognitive training not covered   Louana Terrilyn Christians, PT, DPT 11/20/24 4:54 PM  [facility] [address] [place], [place] [address] Phone # [phone] Fax # [phone]

## 2024-11-20 ENCOUNTER — Other Ambulatory Visit: Payer: Self-pay

## 2024-11-20 ENCOUNTER — Ambulatory Visit: Attending: Family Medicine | Admitting: Physical Therapy

## 2024-11-20 ENCOUNTER — Encounter: Payer: Self-pay | Admitting: Physical Therapy

## 2024-11-20 DIAGNOSIS — H8113 Benign paroxysmal vertigo, bilateral: Secondary | ICD-10-CM | POA: Diagnosis not present

## 2024-11-21 ENCOUNTER — Ambulatory Visit (INDEPENDENT_AMBULATORY_CARE_PROVIDER_SITE_OTHER)

## 2024-11-21 DIAGNOSIS — J3089 Other allergic rhinitis: Secondary | ICD-10-CM | POA: Diagnosis not present

## 2024-11-21 DIAGNOSIS — J309 Allergic rhinitis, unspecified: Secondary | ICD-10-CM | POA: Diagnosis not present

## 2024-11-28 ENCOUNTER — Ambulatory Visit

## 2024-11-28 DIAGNOSIS — H8113 Benign paroxysmal vertigo, bilateral: Secondary | ICD-10-CM | POA: Diagnosis present

## 2024-11-28 NOTE — Therapy (Signed)
 OUTPATIENT PHYSICAL THERAPY VESTIBULAR TREATMENT     Patient Name: Tanner Hall MRN: [id number] DOB:[birth year], 78 y.o., male Today's Date: 11/28/2024  END OF SESSION:  PT End of Session - 11/28/24 1615     Visit Number 2    Number of Visits 5    Date for Recertification  12/18/24    Authorization Type [employer] Medicare    Authorization Time Period auth submitted    PT Start Time 1615    PT Stop Time 1700    PT Time Calculation (min) 45 min    Equipment Utilized During Treatment Gait belt    Activity Tolerance Patient tolerated treatment well    Behavior During Therapy WFL for tasks assessed/performed          Past Medical History:  Diagnosis Date   AAA (abdominal aortic aneurysm)    3.1 cm   BPH (benign prostatic hyperplasia)    Diverticulitis    GI bleed    Hematuria    PVD (peripheral vascular disease)    Past Surgical History:  Procedure Laterality Date   ADENOIDECTOMY     COLONOSCOPY  06/27/2011   CYSTOSCOPY FOR HEMATURIA     NO CANCER FOUND   DOUBLE INGUINAL REPAIR  12/27/1996   KNEE SURGERY     LT PAROTID MASS  05/27/2013   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   PARTIAL COLECTOMY FOR DIVERTICULITIS  10/27/2006   TONSILLECTOMY     Patient Active Problem List   Diagnosis Date Noted   Bilateral hand pain 07/11/2014   Bilateral knee pain 07/11/2014   Low back pain 07/11/2014   OA (osteoarthritis) 07/11/2014    PCP: Tanner Carlin Redbird, MD  REFERRING PROVIDER: [name], MD   REFERRING DIAG: R42 (ICD-10-CM) - Vertigo  THERAPY DIAG:  BPPV (benign paroxysmal positional vertigo), bilateral  ONSET DATE: a couple weeks ago   Rationale for Evaluation and Treatment: Rehabilitation  SUBJECTIVE:   SUBJECTIVE STATEMENT: Feeling a bit better, still dizzy but not as intense   Pt accompanied by: self  PERTINENT HISTORY: AAA, PVD, L parotid mass  PAIN:  Are you having pain? No; pt reports a bad back  PRECAUTIONS: Fall  RED  FLAGS: None   WEIGHT BEARING RESTRICTIONS: No  FALLS: Has patient fallen in last 6 months? No  LIVING ENVIRONMENT: Lives with: lives with their spouse Lives in: House/apartment Stairs: steps to get inside with handrail  PLOF: Independent, Vocation/Vocational requirements: retired , and Leisure: yardwork   PATIENT GOALS: improve dizziness   OBJECTIVE:   TODAY'S TREATMENT: 11/28/24 Activity Comments  Left Dix-Hallpike (loaded) Left upbeating nystagmus x 8-12 sec  Left Epley   Left Dix-hallpike Small amplitude x 4 sec  Left Epley   Brandt-Daroff x 5 reps   Pt education re: BPPV     Note: Objective measures were completed at Evaluation unless otherwise noted.  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   POSTURE:  rounded shoulders and forward head  GAIT: Gait pattern: WFL Assistive device utilized: None Level of assistance: Complete Independence   PATIENT SURVEYS:  DHI: 6/100  VESTIBULAR ASSESSMENT   GENERAL OBSERVATION: pt wears glasses for astigmatism    OCULOMOTOR EXAM: Ocular Alignment: Cover/Uncover Test: WNL Cover/Cross Cover Test: WNL   Ocular ROM: No Limitations   Spontaneous Nystagmus: absent   Gaze-Induced Nystagmus: absent   Smooth Pursuits: intact   Saccades: intact  Convergence/Divergence: 0 cm    VESTIBULAR - OCULAR REFLEX:    Slow VOR: Normal   VOR Cancellation: Normal   Head-Impulse Test: HIT Right: negative HIT Left: covert positive       POSITIONAL TESTING:  Supine>sit: small amplitude nystagmus difficult to discern; and c/o dizziness  Right Roll Test: negative Left Roll Test: negative   Right Loaded Dix-Hallpike: small amplitude upbeating (unable to visualize if torsion) lasting for 10 sec Left Loaded Dix-Hallpike: large amplitude upbeating torsional nystagmus lasting 20 sec                                                                                                                               TREATMENT DATE: 11/20/24   Canalith Repositioning:  Epley Left: Number of Reps: 1 and Comment: tolerated well   Upon retest of L DH: small amplitude L upbeating lasting 15 sec but pt asymptomatic    PATIENT EDUCATION: Education details: prognosis, POC, edu on BPPV and risk factors and handout; edu on Meclizine as vestibular suppressant  Person educated: Patient Education method: Explanation, Demonstration, Tactile cues, Verbal cues, and Handouts Education comprehension: verbalized understanding and returned demonstration  HOME EXERCISE PROGRAM: Access Code: [email or web address] URL: [email or web address] Date: 11/28/2024 Prepared by: Burnard Sandifer  Exercises - Brandt-Daroff Vestibular Exercise  - 1 x daily - 7 x weekly - 5 reps  GOALS: Goals reviewed with patient? Yes  SHORT TERM GOALS: Target date: 12/04/2024  Patient to be independent with initial HEP. Baseline: HEP initiated Goal status: INITIAL    LONG TERM GOALS: Target date: 12/18/2024  Patient to be independent with advanced HEP. Baseline: Not yet initiated  Goal status: INITIAL  Patient will report 0/10 dizziness with bed mobility.  Baseline: Symptomatic  Goal status: INITIAL  Patient to score 0 on DHI in order to improve functional outcomes.  Baseline: 6/100 Goal status: INITIAL   ASSESSMENT:  CLINICAL IMPRESSION: Ongoing positional dizziness w/ left/right upbeating nystagmus present to respective Dix-Hallpike suggestive of canalithiasis.  Addressed w/ two repetitions of left Epley with improved symptoms and degree/time of nystagmus.  Instructed in Brandt-Daroff for habituation/repositioning w/ good return demonstration and decreased symptoms after 3-5 reps performed in alternating manner.  Continue next week to progress POC details    OBJECTIVE IMPAIRMENTS: decreased activity tolerance and dizziness.    ACTIVITY LIMITATIONS: bending, sleeping, and transfers  PARTICIPATION LIMITATIONS: cleaning and laundry  PERSONAL FACTORS: Age, Past/current experiences, Time since onset of injury/illness/exacerbation, and 3+ comorbidities: AAA, PVD, L parotid mass are also affecting patient's functional outcome.   REHAB POTENTIAL: Good  CLINICAL DECISION MAKING: Evolving/moderate complexity  EVALUATION COMPLEXITY: Moderate   PLAN:  PT FREQUENCY: 1x/week  PT DURATION: 4 weeks  PLANNED INTERVENTIONS: 97164- PT Re-evaluation, 97110-Therapeutic exercises, 97530- Therapeutic activity, [id number]- Neuromuscular re-education, 97535- Self Care, 02859- Manual therapy, [id number]- Gait training, [phone]- Canalith repositioning, Patient/Family education, Balance training, Stair training, Taping, Vestibular training, Cryotherapy, and Moist heat  PLAN FOR NEXT SESSION: recheck R  and L DH   Referring diagnosis:  R42 (ICD-10-CM) - Vertigo Treatment diagnosis (if different than referring diagnosis): BPPV (benign paroxysmal positional vertigo), bilateral Date Symptoms Began: 11/12/24 # of Visits requested: 5  Time period for Authorization: 11/20/24 to 12/18/24  What was this (referring dx) caused by? []  Surgery []  Fall []  Ongoing issue []  Arthritis [x]  Other: __insidious__________  Laterality: []  Rt []  Lt [x]  Both  Functional Tool & Score: [facility] 6/100  Check all possible CPT codes:     See Planned Interventions listed in the Plan section of the Evaluation.     If [employer]: Choose 10 or less codes  If [employer] Medicaid: Modalities are not covered  If [employer]: Check allowed ICD code combinations   If [facility] Plan or Cigna: Cognitive training not covered   4:58 PM, 11/28/24 M. Kelly Bettyjane Shenoy, PT, DPT Physical Therapist- [place] Office Number: [phone]

## 2024-11-29 DIAGNOSIS — S76312D Strain of muscle, fascia and tendon of the posterior muscle group at thigh level, left thigh, subsequent encounter: Secondary | ICD-10-CM | POA: Diagnosis not present

## 2024-11-29 DIAGNOSIS — M47816 Spondylosis without myelopathy or radiculopathy, lumbar region: Secondary | ICD-10-CM | POA: Diagnosis not present

## 2024-12-06 ENCOUNTER — Ambulatory Visit

## 2024-12-06 DIAGNOSIS — H8113 Benign paroxysmal vertigo, bilateral: Secondary | ICD-10-CM | POA: Diagnosis not present

## 2024-12-06 NOTE — Therapy (Signed)
 OUTPATIENT PHYSICAL THERAPY VESTIBULAR TREATMENT     Patient Name: Tanner Hall MRN: [id number] DOB:August 12, 1946, 78 y.o., male Today's Date: 12/06/2024  END OF SESSION:  PT End of Session - 12/06/24 1012     Visit Number 3    Number of Visits 5    Date for Recertification  12/18/24    Authorization Type [employer]    Authorization Time Period auth submitted    PT Start Time 1015    PT Stop Time 1100    PT Time Calculation (min) 45 min    Equipment Utilized During Treatment Gait belt    Activity Tolerance Patient tolerated treatment well    Behavior During Therapy WFL for tasks assessed/performed          Past Medical History:  Diagnosis Date   AAA (abdominal aortic aneurysm)    3.1 cm   BPH (benign prostatic hyperplasia)    Diverticulitis    GI bleed    Hematuria    PVD (peripheral vascular disease)    Past Surgical History:  Procedure Laterality Date   ADENOIDECTOMY     COLONOSCOPY  06/27/2011   CYSTOSCOPY FOR HEMATURIA     NO CANCER FOUND   DOUBLE INGUINAL REPAIR  12/27/1996   KNEE SURGERY     LT PAROTID MASS  05/27/2013   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   PARTIAL COLECTOMY FOR DIVERTICULITIS  10/27/2006   TONSILLECTOMY     Patient Active Problem List   Diagnosis Date Noted   Bilateral hand pain 07/11/2014   Bilateral knee pain 07/11/2014   Low back pain 07/11/2014   OA (osteoarthritis) 07/11/2014    PCP: [name], MD  REFERRING PROVIDER: [name], MD   REFERRING DIAG: R42 (ICD-10-CM) - Vertigo  THERAPY DIAG:  BPPV (benign paroxysmal positional vertigo), bilateral  ONSET DATE: a couple weeks ago   Rationale for Evaluation and Treatment: Rehabilitation  SUBJECTIVE:   SUBJECTIVE STATEMENT: Still having some dizziness less intense. The Brandt-Daroff move does trigger some symptoms not a bad vertigo    Pt accompanied by: self  PERTINENT HISTORY: AAA, PVD, L parotid mass  PAIN:  Are you having pain? No;  pt reports a bad back  PRECAUTIONS: Fall  RED FLAGS: None   WEIGHT BEARING RESTRICTIONS: No  FALLS: Has patient fallen in last 6 months? No  LIVING ENVIRONMENT: Lives with: lives with their spouse Lives in: House/apartment Stairs: steps to get inside with handrail  PLOF: Independent, Vocation/Vocational requirements: retired , and Leisure: yardwork   PATIENT GOALS: improve dizziness   OBJECTIVE:   TODAY'S TREATMENT: 12/06/24 Activity Comments  Tanner Hall Modest symptoms when arising  Left Loaded Dix-Hallpike No vertigo/nystagmus  Right loaded Dix-Hallpike Right upbeating 6-7 sec w/ vertigo present  R Epley   Right Dix-Hallpike Brief latency then 6 sec right upbeat nystagmus  R Epley   Right Dix-Hallpike Brief latency then 6 sec right upbeat nystagmus  R Epley      TODAY'S TREATMENT: 11/28/24 Activity Comments  Left Dix-Hallpike (loaded) Left upbeating nystagmus x 8-12 sec  Left Epley   Left Dix-hallpike Small amplitude x 4 sec  Left Epley   Brandt-Daroff x 5 reps   Pt education re: BPPV     Note: Objective measures were completed at Evaluation unless otherwise noted.  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   POSTURE:  rounded shoulders and  forward head  GAIT: Gait pattern: WFL Assistive device utilized: None Level of assistance: Complete Independence   PATIENT SURVEYS:  DHI: 6/100  VESTIBULAR ASSESSMENT   GENERAL OBSERVATION: pt wears glasses for astigmatism    OCULOMOTOR EXAM: Ocular Alignment: Cover/Uncover Test: WNL Cover/Cross Cover Test: WNL   Ocular ROM: No Limitations   Spontaneous Nystagmus: absent   Gaze-Induced Nystagmus: absent   Smooth Pursuits: intact   Saccades: intact   Convergence/Divergence: 0 cm    VESTIBULAR - OCULAR REFLEX:    Slow VOR: Normal   VOR Cancellation: Normal   Head-Impulse Test:  HIT Right: negative HIT Left: covert positive       POSITIONAL TESTING:  Supine>sit: small amplitude nystagmus difficult to discern; and c/o dizziness  Right Roll Test: negative Left Roll Test: negative   Right Loaded Dix-Hallpike: small amplitude upbeating (unable to visualize if torsion) lasting for 10 sec Left Loaded Dix-Hallpike: large amplitude upbeating torsional nystagmus lasting 20 sec                                                                                                                              TREATMENT DATE: 11/20/24   Canalith Repositioning:  Epley Left: Number of Reps: 1 and Comment: tolerated well   Upon retest of L DH: small amplitude L upbeating lasting 15 sec but pt asymptomatic    PATIENT EDUCATION: Education details: prognosis, POC, edu on BPPV and risk factors and handout; edu on Meclizine as vestibular suppressant  Person educated: Patient Education method: Explanation, Demonstration, Tactile cues, Verbal cues, and Handouts Education comprehension: verbalized understanding and returned demonstration  HOME EXERCISE PROGRAM: Access Code: [email or web address] URL: [email or web address] Date: 11/28/2024 Prepared by: Burnard Sandifer  Exercises - Brandt-Daroff Vestibular Exercise  - 1 x daily - 7 x weekly - 5 reps  GOALS: Goals reviewed with patient? Yes  SHORT TERM GOALS: Target date: 12/04/2024  Patient to be independent with initial HEP. Baseline: HEP initiated Goal status: INITIAL    LONG TERM GOALS: Target date: 12/18/2024  Patient to be independent with advanced HEP. Baseline: Not yet initiated  Goal status: INITIAL  Patient will report 0/10 dizziness with bed mobility.  Baseline: Symptomatic  Goal status: INITIAL  Patient to score 0 on DHI in order to improve functional outcomes.  Baseline: 6/100 Goal status: INITIAL   ASSESSMENT:  CLINICAL IMPRESSION: Reports improved symptoms and function with still some vertigo in  varying positions but not as intense.  Positional tests reveal +R Dix-Hallpike with brief latency then right upbeating nystagmus. Addressed with several reps of right Epley maneuver which were tolerated well.  Educated on bilat presentation and benefit of Brandt-Daroff for ongoing repositioning/habituation and verbalized understanding   OBJECTIVE IMPAIRMENTS: decreased activity tolerance and dizziness.   ACTIVITY LIMITATIONS: bending, sleeping, and transfers  PARTICIPATION LIMITATIONS: cleaning and laundry  PERSONAL FACTORS: Age, Past/current experiences, Time since onset of injury/illness/exacerbation, and 3+ comorbidities: AAA, PVD, L parotid mass are also affecting patient's functional  outcome.   REHAB POTENTIAL: Good  CLINICAL DECISION MAKING: Evolving/moderate complexity  EVALUATION COMPLEXITY: Moderate   PLAN:  PT FREQUENCY: 1x/week  PT DURATION: 4 weeks  PLANNED INTERVENTIONS: 97164- PT Re-evaluation, 97110-Therapeutic exercises, 97530- Therapeutic activity, 97112- Neuromuscular re-education, 97535- Self Care, 02859- Manual therapy, [phone]- Gait training, [phone]- Canalith repositioning, Patient/Family education, Balance training, Stair training, Taping, Vestibular training, Cryotherapy, and Moist heat  PLAN FOR NEXT SESSION: recheck R and L DH   Referring diagnosis:  R42 (ICD-10-CM) - Vertigo Treatment diagnosis (if different than referring diagnosis): BPPV (benign paroxysmal positional vertigo), bilateral Date Symptoms Began: 11/12/24 # of Visits requested: 5  Time period for Authorization: 11/20/24 to 12/18/24  What was this (referring dx) caused by? []  Surgery []  Fall []  Ongoing issue []  Arthritis [x]  Other: __insidious__________  Laterality: []  Rt []  Lt [x]  Both  Functional Tool & Score: [facility] 6/100  Check all possible CPT codes:     See Planned Interventions listed in the Plan section of the Evaluation.     If [employer]: Choose 10 or less codes  If Healthy  Blue Managed Medicaid: Modalities are not covered  If [employer]: Check allowed ICD code combinations   If [facility] Plan or [employer]: Cognitive training not covered   10:12 AM, 12/06/2024 M. Kelly Arbutus Nelligan, PT, DPT Physical Therapist- [place] Office Number: [phone]

## 2024-12-18 ENCOUNTER — Ambulatory Visit

## 2024-12-18 DIAGNOSIS — J309 Allergic rhinitis, unspecified: Secondary | ICD-10-CM

## 2024-12-18 DIAGNOSIS — J302 Other seasonal allergic rhinitis: Secondary | ICD-10-CM | POA: Diagnosis not present

## 2024-12-24 ENCOUNTER — Ambulatory Visit

## 2024-12-24 DIAGNOSIS — H8113 Benign paroxysmal vertigo, bilateral: Secondary | ICD-10-CM

## 2024-12-24 NOTE — Therapy (Signed)
 " OUTPATIENT PHYSICAL THERAPY VESTIBULAR TREATMENT, Recertification, and D/C Summary     Patient Name: DEMERE DOTZLER MRN: [id number] DOB:[birth year], 78 y.o., male Today's Date: 12/24/2024  PHYSICAL THERAPY DISCHARGE SUMMARY  Visits from Start of Care: 4  Current functional level related to goals / functional outcomes: See below   Remaining deficits: Reports a perception of unsteadiness/feeling in head   Education / Equipment: HEP   Patient agrees to discharge. Patient goals were met. Patient is being discharged due to meeting the stated rehab goals.  END OF SESSION:  PT End of Session - 12/24/24 1535     Visit Number 4    Number of Visits 5    Date for Recertification  12/24/24    Authorization Type [employer] Medicare    Authorization Time Period auth submitted    PT Start Time 1530    PT Stop Time 1615    PT Time Calculation (min) 45 min    Equipment Utilized During Treatment Gait belt    Activity Tolerance Patient tolerated treatment well    Behavior During Therapy WFL for tasks assessed/performed          Past Medical History:  Diagnosis Date   AAA (abdominal aortic aneurysm)    3.1 cm   BPH (benign prostatic hyperplasia)    Diverticulitis    GI bleed    Hematuria    PVD (peripheral vascular disease)    Past Surgical History:  Procedure Laterality Date   ADENOIDECTOMY     COLONOSCOPY  06/27/2011   CYSTOSCOPY FOR HEMATURIA     NO CANCER FOUND   DOUBLE INGUINAL REPAIR  12/27/1996   KNEE SURGERY     LT PAROTID MASS  05/27/2013   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   PARTIAL COLECTOMY FOR DIVERTICULITIS  10/27/2006   TONSILLECTOMY     Patient Active Problem List   Diagnosis Date Noted   Bilateral hand pain 07/11/2014   Bilateral knee pain 07/11/2014   Low back pain 07/11/2014   OA (osteoarthritis) 07/11/2014    PCP: [name], MD  REFERRING PROVIDER: [name], MD   REFERRING DIAG: R42 (ICD-10-CM) - Vertigo  THERAPY  DIAG:  BPPV (benign paroxysmal positional vertigo), bilateral  ONSET DATE: a couple weeks ago   Rationale for Evaluation and Treatment: Rehabilitation  SUBJECTIVE:   SUBJECTIVE STATEMENT: Still having some dizziness less intense. The Brandt-Daroff move does trigger some symptoms not a bad vertigo    Pt accompanied by: self  PERTINENT HISTORY: AAA, PVD, L parotid mass  PAIN:  Are you having pain? No; pt reports a bad back  PRECAUTIONS: Fall  RED FLAGS: None   WEIGHT BEARING RESTRICTIONS: No  FALLS: Has patient fallen in last 6 months? No  LIVING ENVIRONMENT: Lives with: lives with their spouse Lives in: House/apartment Stairs: steps to get inside with handrail  PLOF: Independent, Vocation/Vocational requirements: retired , and Leisure: yardwork   PATIENT GOALS: improve dizziness   OBJECTIVE:   TODAY'S TREATMENT: 12/24/24 Activity Comments  Roll test Left: negative Right: negative  Dix-Hallpike Left: negative Right: negative  M-CTSIB   FGA 28/30  NU-step        M-CTSIB  Condition 1: Firm Surface, EO 30 Sec, Normal Sway  Condition 2: Firm Surface, EC 30 Sec, Mild Sway  Condition 3: Foam Surface, EO 30 Sec, Normal Sway  Condition 4: Foam Surface, EC 30 Sec, Mild and Moderate Sway     TODAY'S TREATMENT: 12/06/24 Activity Comments  [name]  Modest symptoms when arising  Left Loaded Dix-Hallpike No vertigo/nystagmus  Right loaded Dix-Hallpike Right upbeating 6-7 sec w/ vertigo present  R Epley   Right Dix-Hallpike Brief latency then 6 sec right upbeat nystagmus  R Epley   Right Dix-Hallpike Brief latency then 6 sec right upbeat nystagmus  R Epley      TODAY'S TREATMENT: 11/28/24 Activity Comments  Left Dix-Hallpike (loaded) Left upbeating nystagmus x 8-12 sec  Left Epley   Left Dix-hallpike Small amplitude x 4 sec  Left Epley   Brandt-Daroff x 5 reps   Pt education re: BPPV     Note: Objective measures were completed at Evaluation unless  otherwise noted.  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   POSTURE:  rounded shoulders and forward head  GAIT: Gait pattern: WFL Assistive device utilized: None Level of assistance: Complete Independence   PATIENT SURVEYS:  DHI: 6/100  VESTIBULAR ASSESSMENT   GENERAL OBSERVATION: pt wears glasses for astigmatism    OCULOMOTOR EXAM: Ocular Alignment: Cover/Uncover Test: WNL Cover/Cross Cover Test: WNL   Ocular ROM: No Limitations   Spontaneous Nystagmus: absent   Gaze-Induced Nystagmus: absent   Smooth Pursuits: intact   Saccades: intact   Convergence/Divergence: 0 cm    VESTIBULAR - OCULAR REFLEX:    Slow VOR: Normal   VOR Cancellation: Normal   Head-Impulse Test: HIT Right: negative HIT Left: covert positive       POSITIONAL TESTING:  Supine>sit: small amplitude nystagmus difficult to discern; and c/o dizziness  Right Roll Test: negative Left Roll Test: negative   Right Loaded Dix-Hallpike: small amplitude upbeating (unable to visualize if torsion) lasting for 10 sec Left Loaded Dix-Hallpike: large amplitude upbeating torsional nystagmus lasting 20 sec                                                                                                                              TREATMENT DATE: 11/20/24   Canalith Repositioning:  Epley Left: Number of Reps: 1 and Comment: tolerated well   Upon retest of L DH: small amplitude L upbeating lasting 15 sec but pt asymptomatic    PATIENT EDUCATION: Education details: prognosis, POC, edu on BPPV and risk factors and handout; edu on Meclizine as vestibular suppressant  Person educated: Patient Education method: Explanation, Demonstration, Tactile cues, Verbal cues, and Handouts Education comprehension: verbalized understanding and returned demonstration  HOME EXERCISE PROGRAM: Access  Code: [email or web address] URL: [email or web address] Date: 11/28/2024 Prepared by: Kelly Francesco Provencal  Exercises - Brandt-Daroff Vestibular Exercise  - 1 x daily - 7 x weekly - 5 reps - Corner Balance Feet Together With Eyes Closed  - 1 x daily - 7 x weekly - 3 sets - 30 sec hold - Corner Balance Feet Together: Eyes Closed With Head Turns  - 1 x daily - 7 x weekly - 3 sets - 15-30 sec hold -  Semi-Tandem Corner Balance With Eyes Open  - 7 x weekly - 3 sets - 15-30 sec hold  GOALS: Goals reviewed with patient? Yes  SHORT TERM GOALS: Target date: 12/04/2024  Patient to be independent with initial HEP. Baseline: HEP initiated Goal status: MET    LONG TERM GOALS: Target date: 12/18/2024  Patient to be independent with advanced HEP. Baseline: Not yet initiated  Goal status: MET  Patient will report 0/10 dizziness with bed mobility.  Baseline: Symptomatic; 0/10 Goal status: MET  Patient to score 0 on DHI in order to improve functional outcomes.  Baseline: 6/100; 0 Goal status: MET   ASSESSMENT:  CLINICAL IMPRESSION: Reports no instances of positional dizziness.  Positional tests without provocation of vertigo or observable nystagmus.  Pt reports continued feeling of imbalance and M-CTSIB reveals mild-moderate sway x 30 sec condition 4. Functional Gait Assessment performance and good score of 28/30 indicating low risk for falls with chief issues being tandem walk, walk with eyes closed, backwards walk requiring increased time or slight decrement to postural control.  Review of HEP with addition of static multisensory balance to address postural control/proprioceptive deficits w/ good return demonstration.  Pt confident in self-mgmt at this time and will D/C to HEP   OBJECTIVE IMPAIRMENTS: decreased activity tolerance and dizziness.   ACTIVITY LIMITATIONS: bending, sleeping, and transfers  PARTICIPATION LIMITATIONS: cleaning and laundry  PERSONAL FACTORS: Age, Past/current  experiences, Time since onset of injury/illness/exacerbation, and 3+ comorbidities: AAA, PVD, L parotid mass are also affecting patient's functional outcome.   REHAB POTENTIAL: Good  CLINICAL DECISION MAKING: Evolving/moderate complexity  EVALUATION COMPLEXITY: Moderate   PLAN:  PT FREQUENCY: 1x/week  PT DURATION: 1 sessions  PLANNED INTERVENTIONS: 97164- PT Re-evaluation, 97110-Therapeutic exercises, 97530- Therapeutic activity, 97112- Neuromuscular re-education, 97535- Self Care, 02859- Manual therapy, [phone]- Gait training, [phone]- Canalith repositioning, Patient/Family education, Balance training, Stair training, Taping, Vestibular training, Cryotherapy, and Moist heat  PLAN FOR NEXT SESSION: recheck R and L DH   Referring diagnosis:  R42 (ICD-10-CM) - Vertigo Treatment diagnosis (if different than referring diagnosis): BPPV (benign paroxysmal positional vertigo), bilateral Date Symptoms Began: 11/12/24 # of Visits requested: 1  Time period for Authorization: 12/18/24 to 12/28/2024  What was this (referring dx) caused by? []  Surgery []  Fall []  Ongoing issue []  Arthritis [x]  Other: __insidious__________  Laterality: []  Rt []  Lt [x]  Both  Functional Tool & Score: [facility] 6/100  Check all possible CPT codes:     See Planned Interventions listed in the Plan section of the Evaluation.     If [employer]: Choose 10 or less codes  If Healthy Blue Managed Medicaid: Modalities are not covered  If [employer]: Check allowed ICD code combinations   If [facility] Plan or Cign[employer]: Cognitive training not covered   4:29 PM, 12/24/2024 M. Kelly Jafar Poffenberger, PT, DPT Physical Therapist- [place] Office Number: [phone]    "

## 2025-01-11 ENCOUNTER — Other Ambulatory Visit: Payer: Self-pay

## 2025-01-11 ENCOUNTER — Ambulatory Visit: Admitting: Allergy

## 2025-01-11 VITALS — BP 130/70 | HR 70 | Temp 98.0°F | Resp 19 | Ht 66.75 in | Wt 253.4 lb

## 2025-01-11 DIAGNOSIS — R0982 Postnasal drip: Secondary | ICD-10-CM | POA: Diagnosis not present

## 2025-01-11 DIAGNOSIS — J3089 Other allergic rhinitis: Secondary | ICD-10-CM

## 2025-01-11 DIAGNOSIS — J302 Other seasonal allergic rhinitis: Secondary | ICD-10-CM

## 2025-01-11 MED ORDER — BUDESONIDE 0.5 MG/2ML IN SUSP: RESPIRATORY_TRACT | 5 refills | Status: AC

## 2025-01-11 MED ORDER — BUDESONIDE 0.5 MG/2ML IN SUSP: 0.5000 mg | Freq: Every day | RESPIRATORY_TRACT | 3 refills | Status: DC

## 2025-01-11 NOTE — Progress Notes (Signed)
 "   Follow-up Note  RE: Tanner Hall MRN: [id number] DOB: 01-11-1946 Date of Office Visit: 01/11/2025   History of present illness: Tanner Hall is a 79 y.o. male presenting today for follow-up allergic rhinitis and postnasal drip. He was last seen in the office on 07/11/24 by myself.   Discussed the use of AI scribe software for clinical note transcription with the patient, who gave verbal consent to proceed.  He experiences ongoing issues with postnasal drip and significant morning cough, described as 'coughing up a bunch of junk.' These symptoms have improved compared to the past but persist. He has been on allergy  shots for a year, currently at maintenance dosing monthly, and feels there has been some benefit, though full effects may not yet be evident.  He uses nasal Atrovent , primarily at night, to manage nasal drip and throat changes. It helps to a degree. He also uses a saline spray diligently, which he finds helpful but temporary. He rotates between various allergy  medications, including levocetirizine, Sudafed, Allegra, and Zyrtec, but notes that their effectiveness diminishes over time. He has observed an increase in blood pressure with some allergy  medications, leading him to adjust his regimen.  He recently had to put down a cat that had been in the home for ten years, which he believes may have contributed to his symptoms.  He experiences vertigo and wonders if it could be related to his sinus issues. He describes feeling 'a little hazy' at times but not dizzy. He has completed therapy for vertigo, which involved maneuvers to address potential inner ear issues.  He uses a CPAP machine at night but does not use water in it due to concerns about congestion and bacteria. He cleans his CPAP and nasal rinse bottle regularly with baby shampoo and hot water.      Review of systems: 10pt ROS negative unless noted above in HPI   Past medical/social/surgical/family history have been  reviewed and are unchanged unless specifically indicated below.  No changes  Medication List: Current Outpatient Medications  Medication Sig Dispense Refill   amLODipine (NORVASC) 2.5 MG tablet      budesonide  (PULMICORT ) 0.5 MG/2ML nebulizer solution add 1 vial (2mL) into nasal saline rinse, mix and flush through nasal cavity once a day 2 mL 5   celecoxib (CELEBREX) 200 MG capsule Take 200 mg by mouth 2 (two) times daily.     EPINEPHrine  (EPIPEN  2-PAK) 0.3 mg/0.3 mL IJ SOAJ injection Inject 0.3 mg into the muscle as needed for anaphylaxis. 2 each 1   EPINEPHrine  (NEFFY ) 2 MG/0.1ML SOLN Place 1 spray into the nose as directed. 2 each 1   fexofenadine-pseudoephedrine (ALLEGRA-D 24) 180-240 MG per 24 hr tablet Take 1 tablet by mouth as needed. Alternates with other allergy  meds     finasteride (PROSCAR) 5 MG tablet Take 5 mg by mouth daily.  12   ipratropium (ATROVENT ) 0.06 % nasal spray Place 2 sprays into both nostrils as needed. 15 mL 2   irbesartan (AVAPRO) 300 MG tablet      levocetirizine (XYZAL ) 5 MG tablet Take 1 tablet (5 mg total) by mouth daily as needed for allergies. 90 tablet 1   magnesium oxide (MAG-OX) 400 MG tablet Take 400 mg by mouth as needed. (Patient taking differently: Take 400 mg by mouth 2 (two) times daily.)     Multiple Vitamin (MULTI-VITAMIN) tablet Take 1 tablet by mouth daily.     pravastatin (PRAVACHOL) 10 MG tablet Take 10 mg by  mouth once a week.     pregabalin  (LYRICA ) 75 MG capsule One po qAM and two po qHS 90 capsule 5   RYBELSUS 7 MG TABS Take 1 tablet by mouth daily.     tadalafil (CIALIS) 20 MG tablet 1 tablet as needed Orally Every other day; Duration: 30 days As needed     tiZANidine (ZANAFLEX) 2 MG tablet Take 4 mg by mouth at bedtime.     traMADol (ULTRAM) 50 MG tablet Take by mouth every 6 (six) hours as needed.     aspirin-acetaminophen-caffeine (EXCEDRIN MIGRAINE) 250-250-65 MG per tablet Take 2 tablets by mouth every 6 (six) hours as needed for  headache. (Patient not taking: Reported on 01/11/2025)     predniSONE  (STERAPRED UNI-PAK 21 TAB) 10 MG (21) TBPK tablet Take 10 mg by mouth as needed. (Patient not taking: Reported on 01/11/2025)     RYBELSUS 14 MG TABS Take 1 tablet by mouth every morning.     sildenafil (VIAGRA) 100 MG tablet Take 100 mg by mouth daily as needed for erectile dysfunction. (Patient not taking: Reported on 01/11/2025)     No current facility-administered medications for this visit.     Known medication allergies: Allergies[1]   Physical examination: Blood pressure 130/70, pulse 70, temperature 98 F (36.7 C), resp. rate 19, height 5' 6.75 (1.695 m), weight 253 lb 6.4 oz (114.9 kg), SpO2 95%.  General: Alert, interactive, in no acute distress. HEENT: PERRLA, TMs pearly gray, turbinates non-edematous with clear discharge, post-pharynx non erythematous. Neck: Supple without lymphadenopathy. Lungs: Clear to auscultation without wheezing, rhonchi or rales. {no increased work of breathing. CV: Normal S1, S2 without murmurs. Abdomen: Nondistended, nontender. Skin: Warm and dry, without lesions or rashes. Extremities:  No clubbing, cyanosis or edema. Neuro:   Grossly intact.  Diagnostics/Labs None today  Assessment and plan: Allergic rhinitis Postnasal drip -  Continue avoidance measures grasses, ragweed, weeds, trees, indoor molds, outdoor molds, dust mites, cat, and cockroach - Use Xyzal  (levocetirizine) 5mg  tablet once daily as needed - Use Atrovent  (ipratropium) 0.06% 1-2 sprays per nostril 3-4 times daily as needed (CAN BE OVER DRYING) for nasal drainage - You can use an extra dose of the antihistamine, if needed, for breakthrough symptoms.  - Continue nasal saline rinses 1-2 times daily to remove allergens from the nasal cavities as well as help with mucous clearance   Start addition of budesonide  vial into saline rinse and flush entire bottle through sinuses (see below instructions) - Continue  allergy  shots per protocol.  You are in maintenance dosing.  Recommend have access to epinephrine  device (Neffy ) on your allergy  shot days.    Follow-up in 6 months or sooner if needed  Budesonide  (Pulmicort ) + Saline Irrigation/Rinse  Budesonide  (Pulmicort ) is an anti-inflammatory steroid medication used to decrease nasal and sinus inflammation. It is dispensed in liquid form in a vial. Although it is manufactured for use with a nebulizer, we intend for you to use it with the NeilMed Sinus Rinse bottle (preferred) or a Neti pot.   Instructions:  1) Make 240cc of saline in the NeilMed bottle using the salt packets or your own saline recipe (see separate handout).  2) Add the entire 2cc vial of liquid Budesonide  (Pulmicort ) to the rinse bottle and mix together.  3) While in the shower or over the sink, tilt your head forward to a comfortable level. Put the tip of the sinus rinse bottle in your nostril and aim it towards the crown or top  of your head. Gently squeeze the bottle to flush out your nose. The fluid will circulate in and out of your sinus cavities, coming back out from either nostril or through your mouth. Try not to swallow large quantities and spit it out instead.  4) Perform Budesonide  (Pulmicort ) + Saline irrigations daily.  I appreciate the opportunity to take part in Bearett's care. Please do not hesitate to contact me with questions.  Sincerely,   Danita Brain, MD Allergy /[facility]       [1]  Allergies Allergen Reactions   Dust Mite Extract    Pollen Extract-Tree Extract [Pollen Extract]    "

## 2025-01-11 NOTE — Patient Instructions (Addendum)
-    Continue avoidance measures grasses, ragweed, weeds, trees, indoor molds, outdoor molds, dust mites, cat, and cockroach - Use Xyzal  (levocetirizine) 5mg  tablet once daily as needed - Use Atrovent  (ipratropium) 0.06% 1-2 sprays per nostril 3-4 times daily as needed (CAN BE OVER DRYING) for nasal drainage - You can use an extra dose of the antihistamine, if needed, for breakthrough symptoms.  - Continue nasal saline rinses 1-2 times daily to remove allergens from the nasal cavities as well as help with mucous clearance   Start addition of budesonide  vial into saline rinse and flush entire bottle through sinuses (see below instructions) - Continue allergy  shots per protocol.  You are in maintenance dosing.  Recommend have access to epinephrine  device (Neffy ) on your allergy  shot days.    Follow-up in 6 months or sooner if needed  Budesonide  (Pulmicort ) + Saline Irrigation/Rinse  Budesonide  (Pulmicort ) is an anti-inflammatory steroid medication used to decrease nasal and sinus inflammation. It is dispensed in liquid form in a vial. Although it is manufactured for use with a nebulizer, we intend for you to use it with the NeilMed Sinus Rinse bottle (preferred) or a Neti pot.   Instructions:  1) Make 240cc of saline in the NeilMed bottle using the salt packets or your own saline recipe (see separate handout).  2) Add the entire 2cc vial of liquid Budesonide  (Pulmicort ) to the rinse bottle and mix together.  3) While in the shower or over the sink, tilt your head forward to a comfortable level. Put the tip of the sinus rinse bottle in your nostril and aim it towards the crown or top of your head. Gently squeeze the bottle to flush out your nose. The fluid will circulate in and out of your sinus cavities, coming back out from either nostril or through your mouth. Try not to swallow large quantities and spit it out instead.  4) Perform Budesonide  (Pulmicort ) + Saline irrigations daily.

## 2025-01-15 ENCOUNTER — Encounter: Payer: Self-pay | Admitting: Allergy

## 2025-01-29 ENCOUNTER — Ambulatory Visit

## 2025-01-29 DIAGNOSIS — J302 Other seasonal allergic rhinitis: Secondary | ICD-10-CM | POA: Diagnosis not present

## 2025-07-18 ENCOUNTER — Ambulatory Visit: Admitting: Allergy
